# Patient Record
Sex: Male | Born: 1950 | Race: White | Hispanic: No | Marital: Married | Smoking: Former smoker
Health system: Southern US, Community
[De-identification: ages and names within clinical notes are randomized; demographics above are authoritative.]

## PROBLEM LIST (undated history)

## (undated) DIAGNOSIS — I10 Essential (primary) hypertension: Secondary | ICD-10-CM

## (undated) DIAGNOSIS — N3021 Other chronic cystitis with hematuria: Secondary | ICD-10-CM

## (undated) DIAGNOSIS — I639 Cerebral infarction, unspecified: Secondary | ICD-10-CM

## (undated) DIAGNOSIS — G454 Transient global amnesia: Secondary | ICD-10-CM

## (undated) DIAGNOSIS — E7800 Pure hypercholesterolemia, unspecified: Secondary | ICD-10-CM

## (undated) DIAGNOSIS — E790 Hyperuricemia without signs of inflammatory arthritis and tophaceous disease: Secondary | ICD-10-CM

## (undated) HISTORY — DX: Transient global amnesia: G45.4

## (undated) HISTORY — DX: Pure hypercholesterolemia, unspecified: E78.00

## (undated) HISTORY — DX: Other chronic cystitis with hematuria: N30.21

## (undated) HISTORY — DX: Hyperuricemia without signs of inflammatory arthritis and tophaceous disease: E79.0

## (undated) HISTORY — DX: Cerebral infarction, unspecified: I63.9

## (undated) HISTORY — DX: Essential (primary) hypertension: I10

---

## 2006-07-05 ENCOUNTER — Encounter: Admission: RE | Admit: 2006-07-05 | Discharge: 2006-07-05 | Payer: Self-pay | Admitting: Family Medicine

## 2008-02-01 IMAGING — CT CT ABDOMEN WO/W CM
2 of 7 series · 13 of 32 positions shown, 18 images · IV contrast (READICAT/WATER & [ID] OMNI 300)
Comparison: None.

CLINICAL DATA: Pelvic pain.  Question stones versus diverticulitis. 
 ABDOMEN CT WITHOUT AND WITH CONTRAST:
TECHNIQUE: Multidetector CT imaging of the abdomen was performed both before and during bolus administration of intravenous contrast.
 Contrast:  125 cc Omnipaque 300
TECHNIQUE: Multidetector CT imaging of the pelvis was performed following the standard protocol during bolus administration of intravenous contrast.

[Series 3: abd/pelvis w/ · axial · 0.81mm/px · z∈[-329,-9]mm · 5 of 96 slices shown, 10 images]
[im 16/96  soft-tissue]
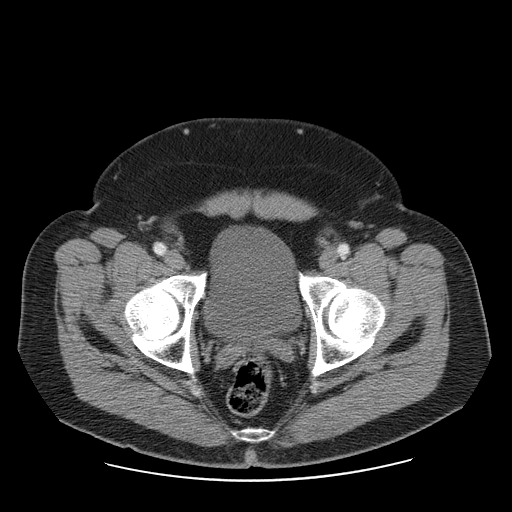
[im 16/96  bone]
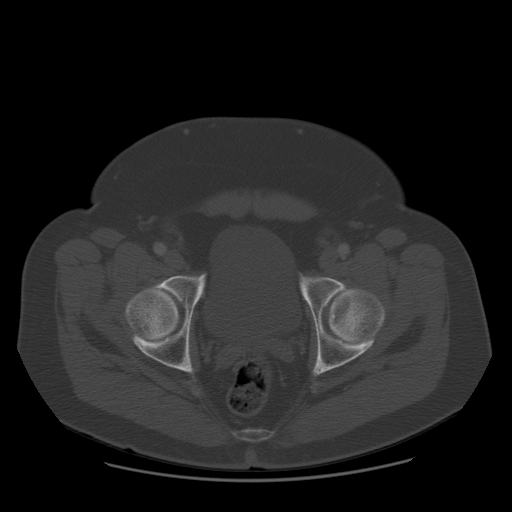
[im 32/96  soft-tissue]
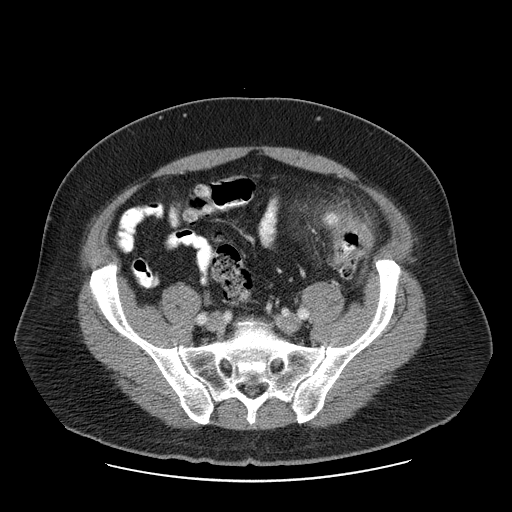
[im 32/96  lung]
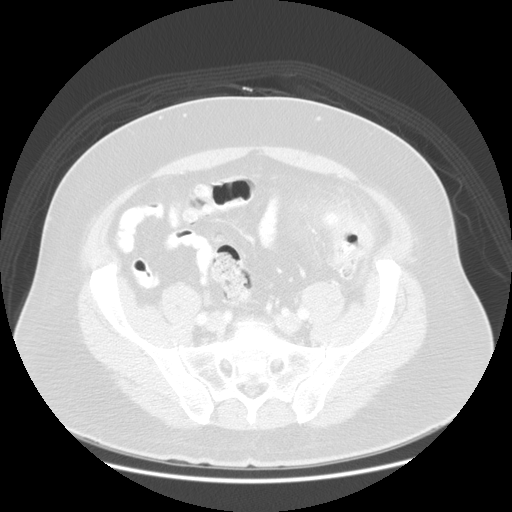
[im 48/96  soft-tissue]
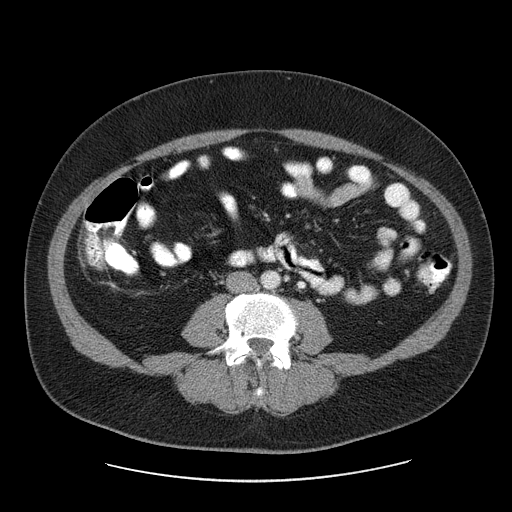
[im 48/96  lung]
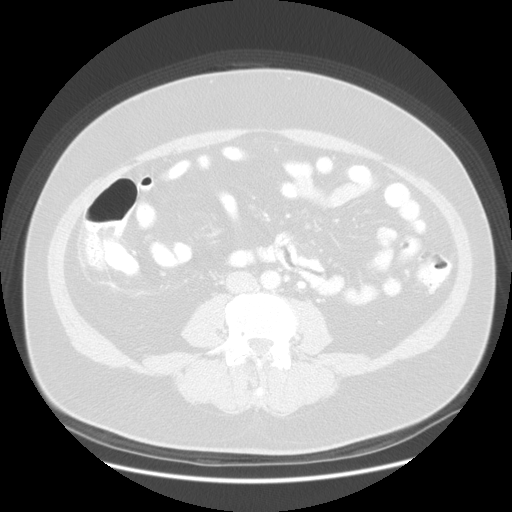
[im 64/96  soft-tissue]
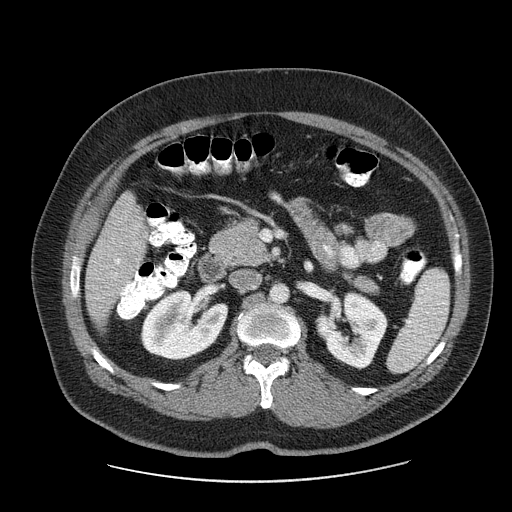
[im 64/96  lung]
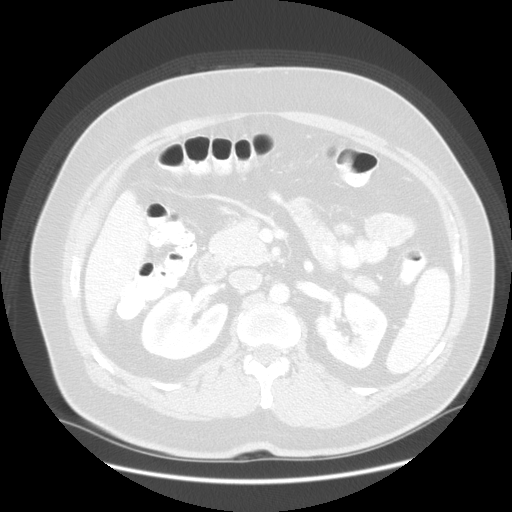
[im 80/96  soft-tissue]
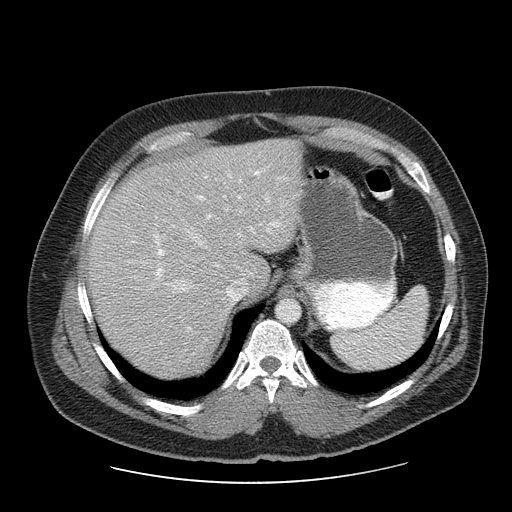
[im 80/96  lung]
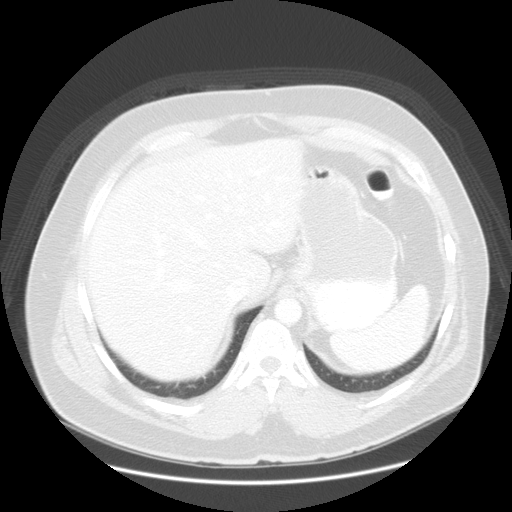

[Series 105: reformatted · sagittal · 0.95mm/px · 8 of 153 slices shown]
[im 17/153  soft-tissue]
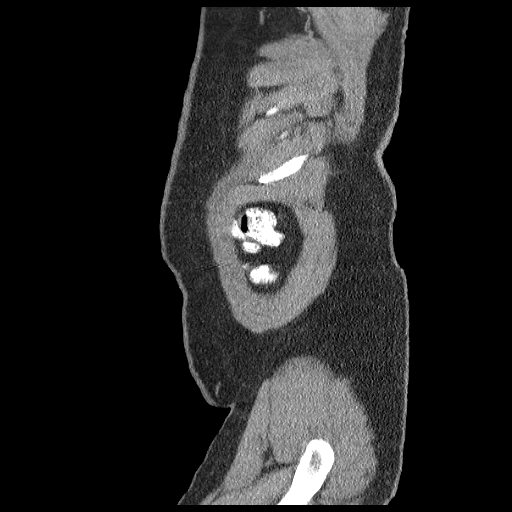
[im 34/153  soft-tissue]
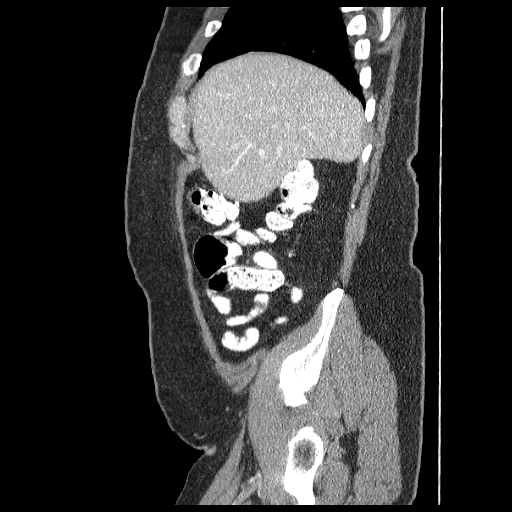
[im 51/153  soft-tissue]
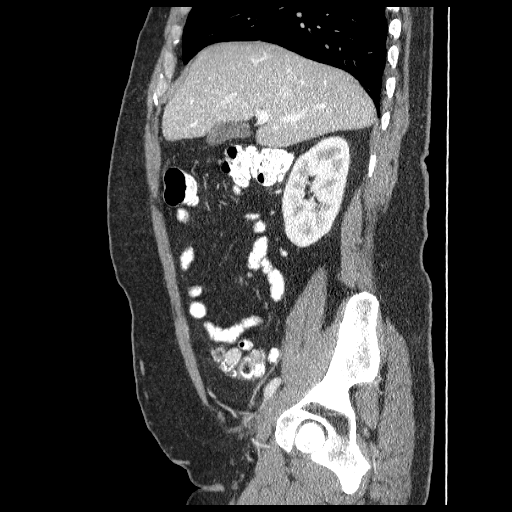
[im 68/153  soft-tissue]
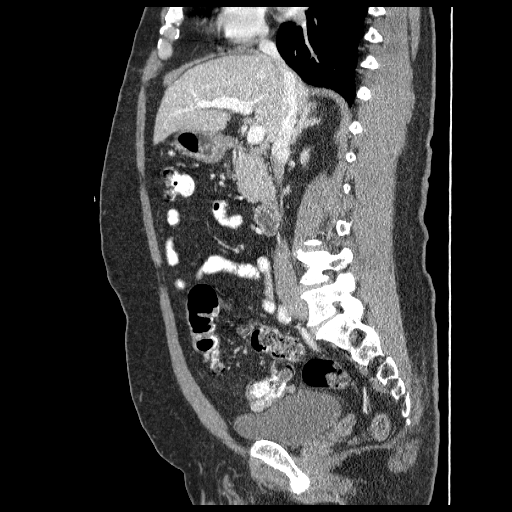
[im 85/153  soft-tissue]
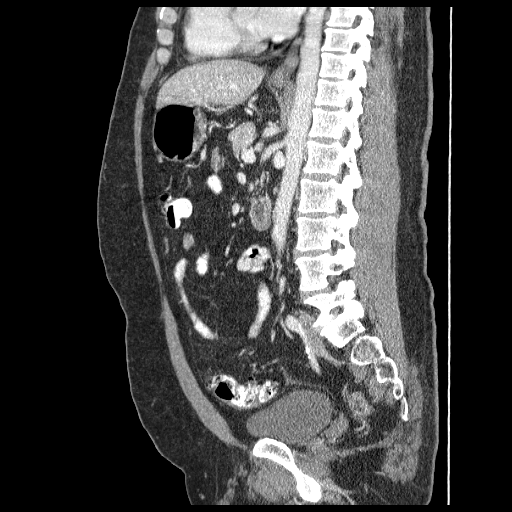
[im 102/153  soft-tissue]
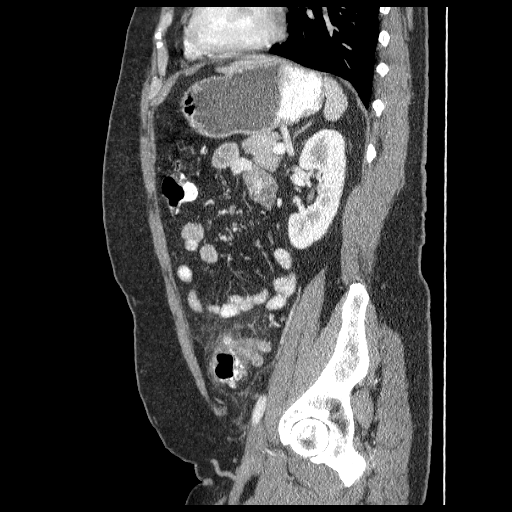
[im 119/153  soft-tissue]
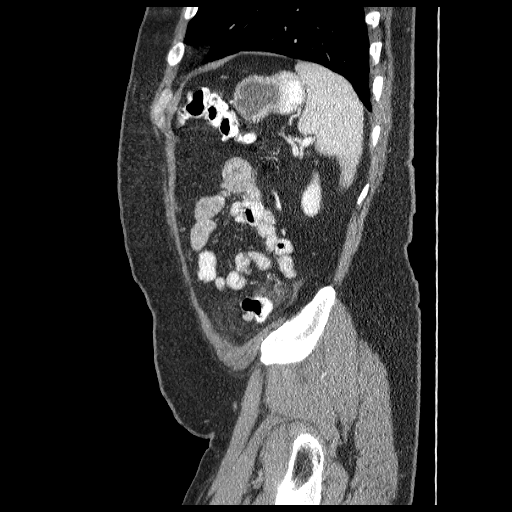
[im 136/153  soft-tissue]
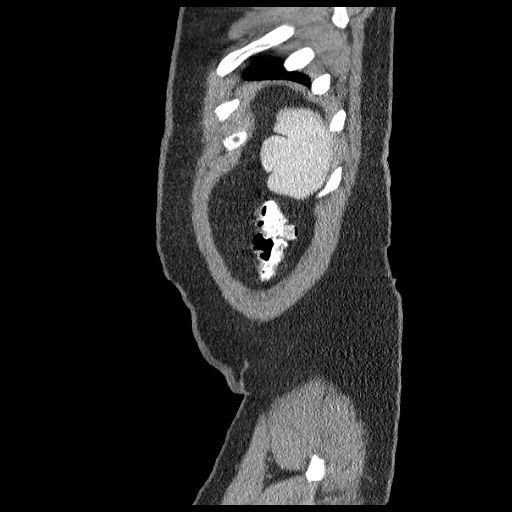

[13 of 32 positions shown; findings below may reference images not displayed]

FINDINGS: Unenhanced images demonstrate no renal calculi or hydronephrosis.  No proximal ureteric stones. 
 Postcontrast images demonstrate clear lung bases.  Normal heart size.  No pericardial or pleural effusion.  Decreased density of the liver consistent with mild steatosis.  No focal lesions in the liver, spleen, stomach, pancreas, gallbladder, adrenal glands, or kidneys.  
 Mildly prominent low retroperitoneal lymph nodes are not pathologic by size criteria and likely reactive.  
 There is no free air.  
 There is pericolonic edema centered at the descending/sigmoid junction most consistent with acute diverticulitis.  There is no drainable abscess or free perforation.  
 The remainder of the colon and appendix are normal.  Small bowel is normal and there is no significant ascites.
IMPRESSION: 1.  Findings consistent with descending/sigmoid colon diverticulitis, uncomplicated. 
 2.  No explanation for patient?s hematuria. 
 3.  Mild fatty liver. 
 PELVIS CT WITH CONTRAST:
FINDINGS: Pelvic large and small bowel loops are within normal limits.  No pelvic adenopathy or ascites.  The urinary bladder and prostate are normal.  
 Bone windows demonstrate no worrisome osseous lesion.
IMPRESSION: Uncomplicated diverticulitis, as above.

## 2008-11-06 ENCOUNTER — Encounter: Admission: RE | Admit: 2008-11-06 | Discharge: 2008-11-06 | Payer: Self-pay | Admitting: Family Medicine

## 2017-10-13 ENCOUNTER — Other Ambulatory Visit: Payer: Self-pay | Admitting: Family Medicine

## 2017-10-13 DIAGNOSIS — Z0000 Encounter for general adult medical examination without abnormal findings: Secondary | ICD-10-CM

## 2017-10-24 ENCOUNTER — Ambulatory Visit
Admission: RE | Admit: 2017-10-24 | Discharge: 2017-10-24 | Disposition: A | Discharge status: Home / Self Care | Payer: Medicare Other | Source: Ambulatory Visit | Attending: Family Medicine | Admitting: Family Medicine

## 2017-10-24 DIAGNOSIS — Z0000 Encounter for general adult medical examination without abnormal findings: Secondary | ICD-10-CM

## 2017-10-28 ENCOUNTER — Encounter: Payer: Self-pay | Admitting: *Deleted

## 2017-11-07 ENCOUNTER — Encounter: Payer: Self-pay | Admitting: Cardiology

## 2017-11-07 ENCOUNTER — Ambulatory Visit: Payer: Medicare Other | Admitting: Cardiology

## 2017-11-07 VITALS — BP 116/74 | HR 49 | Ht 68.5 in | Wt 194.2 lb

## 2017-11-07 DIAGNOSIS — E782 Mixed hyperlipidemia: Secondary | ICD-10-CM

## 2017-11-07 DIAGNOSIS — Z8249 Family history of ischemic heart disease and other diseases of the circulatory system: Secondary | ICD-10-CM | POA: Diagnosis not present

## 2017-11-07 DIAGNOSIS — Z8673 Personal history of transient ischemic attack (TIA), and cerebral infarction without residual deficits: Secondary | ICD-10-CM

## 2017-11-07 NOTE — Patient Instructions (Signed)
Medication Instructions:  Your physician recommends that you continue on your current medications as directed. Please refer to the Current Medication list given to you today.   Labwork: None ordered   Testing/Procedures: Your physician has requested that you have an exercise tolerance test. For further information please visit [email or web address] Please also follow instruction sheet, as given.    Follow-Up: Your physician wants you to follow-up in: 3 years with Dr. Anne FuSkains.  Please call our office to schedule the follow-up appointment.   Any Other Special Instructions Will Be Listed Below (If Applicable).     If you need a refill on your cardiac medications before your next appointment, please call your pharmacy.

## 2017-11-07 NOTE — Progress Notes (Signed)
Cardiology Office Note:    Date:  11/07/2017   ID:  Kevin Beltran, DOB 01/18/1951, MRN [id number]  PCP:  Lupita RaiderShaw, [name], MD  Cardiologist:  No primary care provider on file.   Referring MD: No ref. provider found     History of Present Illness:    Kevin RubensteinMark D Beltran is a 67 y.o. male with family history of ischemic heart disease here for evaluation for potential cardiac disease.  His father died at age 66 from MI.  Paternal grandfather died at age 361 from CAD.  Paternal uncle died at age 66 from CAD.  He used to smoke socially in his 4820s.  He is currently taking atorvastatin 20 mg once a day with fish oil supplementation as well.  No myalgias.  His last hemoglobin A1c was 5.7, sodium 138 potassium 4.9, ALT 31, LDL 68  EKG personally reviewed from 10/13/17 shows sinus rhythm/sinus bradycardia rate 49 with no other significant abnormalities.  Global amnesia 12 years ago. - few hours symptoms. Confused.  Was told that he had a mini stroke.  He was placed on Plavix monotherapy.    Overall, he enjoys hiking, spending time with his grandchildren.  [place] just [place] from [place] helping to take care of 1 of his grandkids that has RSV.  He has not noticed any significant anginal type symptoms.  He thinks that he may have been blessed with more of his mother's genetics than his father's.    Past Medical History:  Diagnosis Date  . Chronic cystitis with hematuria   . CVA (cerebral vascular accident) (HCC)   . Elevated uric acid in blood   . High cholesterol   . Transient global amnesia   . White coat syndrome with diagnosis of hypertension     History reviewed. No pertinent surgical history.  Current Medications: Current Meds  Medication Sig  . atorvastatin (LIPITOR) 20 MG tablet Take 20 mg by mouth daily.  . clopidogrel (PLAVIX) 75 MG tablet Take 75 mg by mouth daily.  . Multiple Vitamin (MULTIVITAMIN) tablet Take 1 tablet by mouth daily.  . Omega-3 Fatty Acids (FISH OIL) 1200 MG CAPS  Take 1 capsule by mouth 2 (two) times daily.     Allergies:   Oruvail [ketoprofen] and Monogen [alitraq]   Social History   Socioeconomic History  . Marital status: Married    Spouse name: [name]  . Number of children: 2  . Years of education: None  . Highest education level: None  Social Needs  . Financial resource strain: None  . Food insecurity - worry: None  . Food insecurity - inability: None  . Transportation needs - medical: None  . Transportation needs - non-medical: None  Occupational History  . Occupation: retired Retail buyernglish teacher  Tobacco Use  . Smoking status: Former Games developermoker  . Smokeless tobacco: Never Used  Substance and Sexual Activity  . Alcohol use: Yes    Comment: 12/week  . Drug use: No  . Sexual activity: None  Other Topics Concern  . None  Social History Narrative  . None     Family History: The patient's family history includes AAA (abdominal aortic aneurysm) in his mother; COPD in his mother; Cancer in his maternal grandfather; Coronary artery disease in his father, paternal grandfather, and paternal uncle; Diabetes in his sister; High Cholesterol in his sister; Hypertension in his sister; Kidney cancer in his mother.  ROS:   Please see the history of present illness.    No fevers  chills nausea vomiting syncope bleeding orthopnea PND chest pain.  All other systems reviewed and are negative.  EKGs/Labs/Other Studies Reviewed:    The following studies were reviewed today: Prior EKG, lab work, office notes reviewed  EKG:  As above  Recent Labs: No results found for requested labs within last 8760 hours.  Recent Lipid Panel No results found for: CHOL, TRIG, HDL, CHOLHDL, VLDL, LDLCALC, LDLDIRECT  Physical Exam:    VS:  BP 116/74   Pulse (!) 49   Ht 5' 8.5" (1.74 m)   Wt 194 lb 3.2 oz (88.1 kg)   SpO2 97%   BMI 29.10 kg/m     Wt Readings from Last 3 Encounters:  11/07/17 194 lb 3.2 oz (88.1 kg)     GEN:  Well nourished, well developed  in no acute distress HEENT: Normal NECK: No JVD; No carotid bruits LYMPHATICS: No lymphadenopathy CARDIAC: RRR, no murmurs, rubs, gallops RESPIRATORY:  Clear to auscultation without rales, wheezing or rhonchi  ABDOMEN: Soft, non-tender, non-distended MUSCULOSKELETAL:  No edema; No deformity  SKIN: Warm and dry NEUROLOGIC:  Alert and oriented x 3 PSYCHIATRIC:  Normal affect   ASSESSMENT:    1. Family history of premature CAD   2. History of TIA (transient ischemic attack)   3. Mixed hyperlipidemia    PLAN:    In order of problems listed above:  Strong early premature family history of CAD -His father had significant CAD in his mid 84s.  He is not having any specific current anginal-like symptoms and he is currently taking atorvastatin 20 mg once a day.  His LDL is well controlled.  He is concerned about the possibility of silent ischemia however and because of this, I will go ahead and set him up for a exercise treadmill test to make sure there are no high risk features.  He also has a prior history of TIA or a small stroke in the past.  Continue with Plavix as secondary prevention.  He is not on an aspirin.  Plavix monotherapy only.  Hyperlipidemia -Continue with atorvastatin 20 mg.  I am fine with him taking omega-3 supplement as well.  Lipids reviewed.  I think would be fine for him to touch base with me again in 3-5 years.  Of course, I will go ahead and give him the results of testing.   Medication Adjustments/Labs and Tests Ordered: Current medicines are reviewed at length with the patient today.  Concerns regarding medicines are outlined above.  Orders Placed This Encounter  Procedures  . EXERCISE TOLERANCE TEST (ETT)   No orders of the defined types were placed in this encounter.   Signed, Donato Schultz, MD  11/07/2017 9:42 AM    [facility]

## 2017-11-22 ENCOUNTER — Ambulatory Visit (INDEPENDENT_AMBULATORY_CARE_PROVIDER_SITE_OTHER): Payer: Medicare Other

## 2017-11-22 DIAGNOSIS — Z8673 Personal history of transient ischemic attack (TIA), and cerebral infarction without residual deficits: Secondary | ICD-10-CM

## 2017-11-22 DIAGNOSIS — Z8249 Family history of ischemic heart disease and other diseases of the circulatory system: Secondary | ICD-10-CM | POA: Diagnosis not present

## 2017-11-23 LAB — EXERCISE TOLERANCE TEST
CHL CUP RESTING HR STRESS: 61 {beats}/min
CSEPED: 8 min
CSEPEW: 10.1 METS
Exercise duration (sec): 36 s
MPHR: 155 {beats}/min
Peak HR: 150 {beats}/min
Percent HR: 96 %
RPE: 14

## 2017-11-25 ENCOUNTER — Encounter: Payer: Self-pay | Admitting: Cardiology

## 2017-11-25 ENCOUNTER — Telehealth: Payer: Self-pay | Admitting: Cardiology

## 2017-11-25 NOTE — Telephone Encounter (Signed)
Reviewed abnormal GXT results with patient.  Scheduled appt for Monday 3/11 with Dr Anne FuSkains to discuss cardiac cath.

## 2017-11-25 NOTE — Telephone Encounter (Signed)
Kevin Beltran is calling to get more details about his stress test was only told that it was abnormal . Would like more information . Please call

## 2017-11-28 ENCOUNTER — Ambulatory Visit: Payer: Medicare Other | Admitting: Cardiology

## 2017-11-28 ENCOUNTER — Encounter: Payer: Self-pay | Admitting: Cardiology

## 2017-11-28 VITALS — BP 144/80 | HR 65 | Ht 68.5 in | Wt 197.0 lb

## 2017-11-28 DIAGNOSIS — Z01818 Encounter for other preprocedural examination: Secondary | ICD-10-CM | POA: Diagnosis not present

## 2017-11-28 DIAGNOSIS — R9439 Abnormal result of other cardiovascular function study: Secondary | ICD-10-CM

## 2017-11-28 DIAGNOSIS — Z8249 Family history of ischemic heart disease and other diseases of the circulatory system: Secondary | ICD-10-CM

## 2017-11-28 DIAGNOSIS — E782 Mixed hyperlipidemia: Secondary | ICD-10-CM | POA: Diagnosis not present

## 2017-11-28 DIAGNOSIS — Z8673 Personal history of transient ischemic attack (TIA), and cerebral infarction without residual deficits: Secondary | ICD-10-CM

## 2017-11-28 NOTE — Patient Instructions (Addendum)
Medication Instructions:  The current medical regimen is effective;  continue present plan and medications.  Labwork: Please have blood work today. (CBC,BMP)  Testing/Procedures: Your physician has requested that you have a cardiac catheterization. Cardiac catheterization is used to diagnose and/or treat various heart conditions. Doctors may recommend this procedure for a number of different reasons. The most common reason is to evaluate chest pain. Chest pain can be a symptom of coronary artery disease (CAD), and cardiac catheterization can show whether plaque is narrowing or blocking your heart's arteries. This procedure is also used to evaluate the valves, as well as measure the blood flow and oxygen levels in different parts of your heart. For further information please visit [email or web address] Please follow instruction sheet, as given.  Follow-Up: Follow up after your cardiac cath.  If you need a refill on your cardiac medications before your next appointment, please call your pharmacy.  Thank you for choosing [facility]!!       [facility] [address] [facility] [address], [address] [place] [address] Dept: [phone] Loc: [phone]  Shelly RubensteinMark D Happe  11/28/2017  You are scheduled for a Cardiac Cath on Thursday December 01, 2017 with Dr. Cristal Deerhristopher End.  1. [facility]: [address] [place] [address] at 5:30 AM (two hours before your procedure to ensure your preparation). Free valet parking service is available.   Special note: Every effort is made to have your procedure done on time. Please understand that emergencies sometimes delay scheduled procedures.  2. Diet: Nothing to eat or drink after midnight.  3. Labs:  Please have blood work today (CBC,BMP)  4. Medication instructions in preparation for your  procedure:  On the morning of your procedure, take your ASA and any morning medicines.  You may use sips of water.  5. Plan for one night stay--bring personal belongings. 6. Bring a current list of your medications and current insurance cards. 7. You MUST have a responsible person to drive you home. 8. Someone MUST be with you the first 24 hours after you arrive home or your discharge will be delayed. 9. Please wear clothes that are easy to get on and off and wear slip-on shoes.  Thank you for allowing us to care for you!   -- [facility]

## 2017-11-28 NOTE — Progress Notes (Signed)
Cardiology Office Note:    Date:  11/28/2017   ID:  Kevin Beltran, DOB 01/21/1951, MRN [id number]  PCP:  Lupita RaiderShaw, [name], MD  Cardiologist:  No primary care provider on file.   Referring MD: Lupita RaiderShaw, [name], MD     History of Present Illness:    Kevin Beltran is a 67 y.o. male with family history of ischemic heart disease here for follow up of potential cardiac disease, with abnormal ETT.  His father died at age 66 from MI.  Paternal grandfather died at age 66 from CAD.  Paternal uncle died at age 66 from CAD.  He used to smoke socially in his 3420s.  He is currently taking atorvastatin 20 mg once a day with fish oil supplementation as well.  No myalgias.  His last hemoglobin A1c was 5.7, sodium 138 potassium 4.9, ALT 31, LDL 68  EKG personally reviewed from 10/13/17 shows sinus rhythm/sinus bradycardia rate 49 with no other significant abnormalities.  Global amnesia 12 years ago. - few hours symptoms. Confused.  Was told that he had a mini stroke.  He was placed on Plavix monotherapy.    Overall, he enjoys hiking, spending time with his grandchildren.  He just came [place] from the mountains helping to take care of 1 of his grandkids that has RSV.  He has not noticed any significant anginal type symptoms.  He thinks that he may have been blessed with more of his mother's genetics than his father's.  11/28/17 - had ETT which demonstrated markedly reduced ST segment depression 2 mm.  Based upon this, we are here to discuss cardiac catheterization. Now with no SOB, no bleeding, no orthopnea.     Past Medical History:  Diagnosis Date  . Chronic cystitis with hematuria   . CVA (cerebral vascular accident) (HCC)   . Elevated uric acid in blood   . High cholesterol   . Transient global amnesia   . White coat syndrome with diagnosis of hypertension     History reviewed. No pertinent surgical history.  Current Medications: Current Meds  Medication Sig  . atorvastatin (LIPITOR) 20 MG tablet  Take 20 mg by mouth daily.  . clopidogrel (PLAVIX) 75 MG tablet Take 75 mg by mouth daily.  . Multiple Vitamin (MULTIVITAMIN) tablet Take 1 tablet by mouth daily.  . Omega-3 Fatty Acids (FISH OIL) 1200 MG CAPS Take 1 capsule by mouth 2 (two) times daily.     Allergies:   Oruvail [ketoprofen] and Monogen [alitraq]   Social History   Socioeconomic History  . Marital status: Married    Spouse name: Kevin Beltran  . Number of children: 2  . Years of education: None  . Highest education level: None  Social Needs  . Financial resource strain: None  . Food insecurity - worry: None  . Food insecurity - inability: None  . Transportation needs - medical: None  . Transportation needs - non-medical: None  Occupational History  . Occupation: retired Retail buyernglish teacher  Tobacco Use  . Smoking status: Former Games developermoker  . Smokeless tobacco: Never Used  Substance and Sexual Activity  . Alcohol use: Yes    Comment: 12/week  . Drug use: No  . Sexual activity: None  Other Topics Concern  . None  Social History Narrative  . None     Family History: The patient's family history includes AAA (abdominal aortic aneurysm) in his mother; COPD in his mother; Cancer in his maternal grandfather; Coronary artery disease in his father, paternal grandfather,  and paternal uncle; Diabetes in his sister; High Cholesterol in his sister; Hypertension in his sister; Kidney cancer in his mother.  ROS:   Please see the history of present illness.    All other ROS neg  EKGs/Labs/Other Studies Reviewed:    The following studies were reviewed today: Prior EKG, lab work, office notes reviewed  EKG:  As above  Recent Labs: No results found for requested labs within last 8760 hours.  Recent Lipid Panel No results found for: CHOL, TRIG, HDL, CHOLHDL, VLDL, LDLCALC, LDLDIRECT  Physical Exam:    VS:  BP (!) 144/80   Pulse 65   Ht 5' 8.5" (1.74 m)   Wt 197 lb (89.4 kg)   BMI 29.52 kg/m     Wt Readings from Last 3  Encounters:  11/28/17 197 lb (89.4 kg)  11/07/17 194 lb 3.2 oz (88.1 kg)     GEN: Well nourished, well developed, in no acute distress  HEENT: normal  Neck: no JVD, carotid bruits, or masses Cardiac: RRR; no murmurs, rubs, or gallops,no edema  Respiratory:  clear to auscultation bilaterally, normal work of breathing GI: soft, nontender, nondistended, + BS MS: no deformity or atrophy  Skin: warm and dry, no rash Neuro:  Alert and Oriented x 3, Strength and sensation are intact Psych: euthymic mood, full affect   ASSESSMENT:    1. Abnormal stress test   2. Pre-operative examination   3. Family history of premature CAD   4. Mixed hyperlipidemia   5. History of TIA (transient ischemic attack)    PLAN:    In order of problems listed above:  Abnormal stress test/strong early premature family history of CAD --Exercise treadmill test abnormal with 2 mm ST segment depression.  With father having significant CAD in his mid 78s and his son already having coronary artery disease as well as his prior history of TIA, we will go ahead and proceed with cardiac catheterization.  Risks and benefits discussed including stroke heart attack death renal impairment bleeding.  He is willing to proceed.  He is currently on Plavix monotherapy after his prior TIA.  We will continue with this.  Hyperlipidemia -Continue with atorvastatin 20 mg.  I am fine with him taking omega-3 supplement as well.  Lipids reviewed.    Medication Adjustments/Labs and Tests Ordered: Current medicines are reviewed at length with the patient today.  Concerns regarding medicines are outlined above.  Orders Placed This Encounter  Procedures  . CBC  . Basic metabolic panel   No orders of the defined types were placed in this encounter.   Signed, Donato Schultz, MD  11/28/2017 2:29 PM    [facility]

## 2017-11-28 NOTE — H&P (View-Only) (Signed)
Cardiology Office Note:    Date:  11/28/2017   ID:  Kevin Beltran Kiser, DOB 01/21/1951, MRN [id number]  PCP:  Lupita RaiderShaw, [name], MD  Cardiologist:  No primary care provider on file.   Referring MD: Lupita RaiderShaw, [name], MD     History of Present Illness:    Kevin Beltran [place] is a 67 y.o. male with family history of ischemic heart disease here for follow up of potential cardiac disease, with abnormal ETT.  His father died at age 66 from MI.  Paternal grandfather died at age 66 from CAD.  Paternal uncle died at age 66 from CAD.  He used to smoke socially in his 3420s.  He is currently taking atorvastatin 20 mg once a day with fish oil supplementation as well.  No myalgias.  His last hemoglobin A1c was 5.7, sodium 138 potassium 4.9, ALT 31, LDL 68  EKG personally reviewed from 10/13/17 shows sinus rhythm/sinus bradycardia rate 49 with no other significant abnormalities.  Global amnesia 12 years ago. - few hours symptoms. Confused.  Was told that he had a mini stroke.  He was placed on Plavix monotherapy.    Overall, he enjoys hiking, spending time with his grandchildren.  He just [place] from [place] helping to take care of 1 of his grandkids that has RSV.  He has not noticed any significant anginal type symptoms.  He thinks that he may have been blessed with more of his mother's genetics than his father's.  11/28/17 - had ETT which demonstrated markedly reduced ST segment depression 2 mm.  Based upon this, we are here to discuss cardiac catheterization. Now with no SOB, no bleeding, no orthopnea.     Past Medical History:  Diagnosis Date  . Chronic cystitis with hematuria   . CVA (cerebral vascular accident) (HCC)   . Elevated uric acid in blood   . High cholesterol   . Transient global amnesia   . White coat syndrome with diagnosis of hypertension     History reviewed. No pertinent surgical history.  Current Medications: Current Meds  Medication Sig  . atorvastatin (LIPITOR) 20 MG tablet  Take 20 mg by mouth daily.  . clopidogrel (PLAVIX) 75 MG tablet Take 75 mg by mouth daily.  . Multiple Vitamin (MULTIVITAMIN) tablet Take 1 tablet by mouth daily.  . Omega-3 Fatty Acids (FISH OIL) 1200 MG CAPS Take 1 capsule by mouth 2 (two) times daily.     Allergies:   Oruvail [ketoprofen] and Monogen [alitraq]   Social History   Socioeconomic History  . Marital status: Married    Spouse name: [name]  . Number of children: 2  . Years of education: None  . Highest education level: None  Social Needs  . Financial resource strain: None  . Food insecurity - worry: None  . Food insecurity - inability: None  . Transportation needs - medical: None  . Transportation needs - non-medical: None  Occupational History  . Occupation: retired Retail buyernglish teacher  Tobacco Use  . Smoking status: Former Games developermoker  . Smokeless tobacco: Never Used  Substance and Sexual Activity  . Alcohol use: Yes    Comment: 12/week  . Drug use: No  . Sexual activity: None  Other Topics Concern  . None  Social History Narrative  . None     Family History: The patient's family history includes AAA (abdominal aortic aneurysm) in his mother; COPD in his mother; Cancer in his maternal grandfather; Coronary artery disease in his father, paternal grandfather,  and paternal uncle; Diabetes in his sister; High Cholesterol in his sister; Hypertension in his sister; Kidney cancer in his mother.  ROS:   Please see the history of present illness.    All other ROS neg  EKGs/Labs/Other Studies Reviewed:    The following studies were reviewed today: Prior EKG, lab work, office notes reviewed  EKG:  As above  Recent Labs: No results found for requested labs within last 8760 hours.  Recent Lipid Panel No results found for: CHOL, TRIG, HDL, CHOLHDL, VLDL, LDLCALC, LDLDIRECT  Physical Exam:    VS:  BP (!) 144/80   Pulse 65   Ht 5' 8.5" (1.74 m)   Wt 197 lb (89.4 kg)   BMI 29.52 kg/m     Wt Readings from Last 3  Encounters:  11/28/17 197 lb (89.4 kg)  11/07/17 194 lb 3.2 oz (88.1 kg)     GEN: Well nourished, well developed, in no acute distress  HEENT: normal  Neck: no JVD, carotid bruits, or masses Cardiac: RRR; no murmurs, rubs, or gallops,no edema  Respiratory:  clear to auscultation bilaterally, normal work of breathing GI: soft, nontender, nondistended, + BS MS: no deformity or atrophy  Skin: warm and dry, no rash Neuro:  Alert and Oriented x 3, Strength and sensation are intact Psych: euthymic mood, full affect   ASSESSMENT:    1. Abnormal stress test   2. Pre-operative examination   3. Family history of premature CAD   4. Mixed hyperlipidemia   5. History of TIA (transient ischemic attack)    PLAN:    In order of problems listed above:  Abnormal stress test/strong early premature family history of CAD --Exercise treadmill test abnormal with 2 mm ST segment depression.  With father having significant CAD in his mid 78s and his son already having coronary artery disease as well as his prior history of TIA, we will go ahead and proceed with cardiac catheterization.  Risks and benefits discussed including stroke heart attack death renal impairment bleeding.  He is willing to proceed.  He is currently on Plavix monotherapy after his prior TIA.  We will continue with this.  Hyperlipidemia -Continue with atorvastatin 20 mg.  I am fine with him taking omega-3 supplement as well.  Lipids reviewed.    Medication Adjustments/Labs and Tests Ordered: Current medicines are reviewed at length with the patient today.  Concerns regarding medicines are outlined above.  Orders Placed This Encounter  Procedures  . CBC  . Basic metabolic panel   No orders of the defined types were placed in this encounter.   Signed, Donato Schultz, MD  11/28/2017 2:29 PM    [facility]

## 2017-11-29 ENCOUNTER — Telehealth: Payer: Self-pay | Admitting: *Deleted

## 2017-11-29 LAB — BASIC METABOLIC PANEL
BUN / CREAT RATIO: 21 (ref 10–24)
BUN: 21 mg/dL (ref 8–27)
CO2: 22 mmol/L (ref 20–29)
CREATININE: 1.02 mg/dL (ref 0.76–1.27)
Calcium: 9.5 mg/dL (ref 8.6–10.2)
Chloride: 106 mmol/L (ref 96–106)
GFR, EST AFRICAN AMERICAN: 89 mL/min/{1.73_m2} (ref >59)
GFR, EST NON AFRICAN AMERICAN: 77 mL/min/{1.73_m2} (ref >59)
GLUCOSE: 84 mg/dL (ref 65–99)
Potassium: 4.4 mmol/L (ref 3.5–5.2)
SODIUM: 145 mmol/L — AB (ref 134–144)

## 2017-11-29 LAB — CBC
HEMATOCRIT: 46.6 % (ref 37.5–51.0)
HEMOGLOBIN: 15.9 g/dL (ref 13.0–17.7)
MCH: 30.3 pg (ref 26.6–33.0)
MCHC: 34.1 g/dL (ref 31.5–35.7)
MCV: 89 fL (ref 79–97)
Platelets: 203 10*3/uL (ref 150–379)
RBC: 5.25 x10E6/uL (ref 4.14–5.80)
RDW: 12.9 % (ref 12.3–15.4)
WBC: 9.8 10*3/uL (ref 3.4–10.8)

## 2017-11-29 NOTE — Telephone Encounter (Addendum)
Pt contacted pre-catheterization scheduled at [facility] for: Wednesday December 01, 2017 7:30 AM Verified [facility] and [facility] at: 5:30 AM  Nothing to eat or drink after midnight prior to cath Verified no diabetes medications Verified allergies in Epic.   AM meds can be  taken pre-cath with sip of water including: ASA 81 mg  Plavix 75 mg -Pt is on this for a history of TIA-Pt states he takes in the evening, will take evening prior to cath.   Confirmed patient has responsible person to drive home post procedure and observe patient for 24 hours: yes

## 2017-12-01 ENCOUNTER — Other Ambulatory Visit: Payer: Self-pay

## 2017-12-01 ENCOUNTER — Ambulatory Visit (HOSPITAL_COMMUNITY)
Admission: RE | Disposition: A | Discharge status: Home / Self Care | Payer: Self-pay | Source: Ambulatory Visit | Attending: Internal Medicine

## 2017-12-01 ENCOUNTER — Ambulatory Visit (HOSPITAL_COMMUNITY)
Admission: RE | Admit: 2017-12-01 | Discharge: 2017-12-01 | Disposition: A | Discharge status: Home / Self Care | Payer: Medicare Other | Source: Ambulatory Visit | Attending: Internal Medicine | Admitting: Internal Medicine

## 2017-12-01 ENCOUNTER — Encounter (HOSPITAL_COMMUNITY): Payer: Self-pay | Admitting: Internal Medicine

## 2017-12-01 DIAGNOSIS — I2510 Atherosclerotic heart disease of native coronary artery without angina pectoris: Secondary | ICD-10-CM | POA: Insufficient documentation

## 2017-12-01 DIAGNOSIS — Z8673 Personal history of transient ischemic attack (TIA), and cerebral infarction without residual deficits: Secondary | ICD-10-CM | POA: Diagnosis not present

## 2017-12-01 DIAGNOSIS — Z7902 Long term (current) use of antithrombotics/antiplatelets: Secondary | ICD-10-CM | POA: Insufficient documentation

## 2017-12-01 DIAGNOSIS — I10 Essential (primary) hypertension: Secondary | ICD-10-CM | POA: Insufficient documentation

## 2017-12-01 DIAGNOSIS — Z8249 Family history of ischemic heart disease and other diseases of the circulatory system: Secondary | ICD-10-CM | POA: Diagnosis not present

## 2017-12-01 DIAGNOSIS — Z87891 Personal history of nicotine dependence: Secondary | ICD-10-CM | POA: Insufficient documentation

## 2017-12-01 DIAGNOSIS — R9439 Abnormal result of other cardiovascular function study: Secondary | ICD-10-CM | POA: Diagnosis present

## 2017-12-01 DIAGNOSIS — N3021 Other chronic cystitis with hematuria: Secondary | ICD-10-CM | POA: Insufficient documentation

## 2017-12-01 DIAGNOSIS — E782 Mixed hyperlipidemia: Secondary | ICD-10-CM | POA: Diagnosis not present

## 2017-12-01 HISTORY — PX: LEFT HEART CATH AND CORONARY ANGIOGRAPHY: CATH118249

## 2017-12-01 LAB — PROTIME-INR
INR: 1.03
PROTHROMBIN TIME: 13.4 s (ref 11.4–15.2)

## 2017-12-01 SURGERY — LEFT HEART CATH AND CORONARY ANGIOGRAPHY
Anesthesia: LOCAL

## 2017-12-01 MED ORDER — MIDAZOLAM HCL 2 MG/2ML IJ SOLN
INTRAMUSCULAR | Status: AC
  Filled 2017-12-01: qty 2

## 2017-12-01 MED ORDER — FENTANYL CITRATE (PF) 100 MCG/2ML IJ SOLN
INTRAMUSCULAR | Status: AC
  Filled 2017-12-01: qty 2

## 2017-12-01 MED ORDER — VERAPAMIL HCL 2.5 MG/ML IV SOLN
INTRAVENOUS | Status: AC
Start: 2017-12-01 — End: ?
  Filled 2017-12-01: qty 2

## 2017-12-01 MED ORDER — HEPARIN SODIUM (PORCINE) 1000 UNIT/ML IJ SOLN
INTRAMUSCULAR | Status: DC | PRN
  Administered 2017-12-01: 4500 [IU] via INTRAVENOUS

## 2017-12-01 MED ORDER — SODIUM CHLORIDE 0.9 % WEIGHT BASED INFUSION: 1.0000 mL/kg/h | INTRAVENOUS | Status: DC

## 2017-12-01 MED ORDER — CLOPIDOGREL BISULFATE 75 MG PO TABS
ORAL_TABLET | ORAL | Status: AC
  Administered 2017-12-01: 75 mg via ORAL
  Filled 2017-12-01: qty 1

## 2017-12-01 MED ORDER — MIDAZOLAM HCL 2 MG/2ML IJ SOLN
INTRAMUSCULAR | Status: DC | PRN
  Administered 2017-12-01: 1 mg via INTRAVENOUS

## 2017-12-01 MED ORDER — SODIUM CHLORIDE 0.9 % IV SOLN: INTRAVENOUS | Status: DC

## 2017-12-01 MED ORDER — HEPARIN (PORCINE) IN NACL 2-0.9 UNIT/ML-% IJ SOLN
INTRAMUSCULAR | Status: AC
  Filled 2017-12-01: qty 1000

## 2017-12-01 MED ORDER — IOPAMIDOL (ISOVUE-370) INJECTION 76%
INTRAVENOUS | Status: AC
Start: 2017-12-01 — End: ?
  Filled 2017-12-01: qty 100

## 2017-12-01 MED ORDER — HEPARIN SODIUM (PORCINE) 1000 UNIT/ML IJ SOLN
INTRAMUSCULAR | Status: AC
  Filled 2017-12-01: qty 1

## 2017-12-01 MED ORDER — ASPIRIN 81 MG PO CHEW: 81.0000 mg | CHEWABLE_TABLET | ORAL | Status: DC

## 2017-12-01 MED ORDER — IOPAMIDOL (ISOVUE-370) INJECTION 76%
INTRAVENOUS | Status: DC | PRN
  Administered 2017-12-01: 50 mL via INTRA_ARTERIAL

## 2017-12-01 MED ORDER — SODIUM CHLORIDE 0.9% FLUSH: 3.0000 mL | Freq: Two times a day (BID) | INTRAVENOUS | Status: DC

## 2017-12-01 MED ORDER — HEPARIN (PORCINE) IN NACL 2-0.9 UNIT/ML-% IJ SOLN
INTRAMUSCULAR | Status: DC | PRN
  Administered 2017-12-01: 10 mL via INTRA_ARTERIAL

## 2017-12-01 MED ORDER — LIDOCAINE HCL (PF) 1 % IJ SOLN
INTRAMUSCULAR | Status: DC | PRN
  Administered 2017-12-01: 2 mL via SUBCUTANEOUS

## 2017-12-01 MED ORDER — SODIUM CHLORIDE 0.9% FLUSH: 3.0000 mL | INTRAVENOUS | Status: DC | PRN

## 2017-12-01 MED ORDER — SODIUM CHLORIDE 0.9 % IV SOLN: 250.0000 mL | INTRAVENOUS | Status: DC | PRN

## 2017-12-01 MED ORDER — LIDOCAINE HCL (PF) 1 % IJ SOLN
INTRAMUSCULAR | Status: AC
  Filled 2017-12-01: qty 30

## 2017-12-01 MED ORDER — HEPARIN (PORCINE) IN NACL 2-0.9 UNIT/ML-% IJ SOLN
INTRAMUSCULAR | Status: AC | PRN
  Administered 2017-12-01 (×2): 500 mL

## 2017-12-01 MED ORDER — FENTANYL CITRATE (PF) 100 MCG/2ML IJ SOLN
INTRAMUSCULAR | Status: DC | PRN
  Administered 2017-12-01: 50 ug via INTRAVENOUS

## 2017-12-01 MED ORDER — SODIUM CHLORIDE 0.9 % WEIGHT BASED INFUSION
3.0000 mL/kg/h | INTRAVENOUS | Status: AC
  Administered 2017-12-01: 3 mL/kg/h via INTRAVENOUS

## 2017-12-01 MED ORDER — CLOPIDOGREL BISULFATE 75 MG PO TABS
75.0000 mg | ORAL_TABLET | Freq: Once | ORAL | Status: AC
  Administered 2017-12-01: 75 mg via ORAL

## 2017-12-01 SURGICAL SUPPLY — 13 items
BAND CMPR LRG ZPHR (HEMOSTASIS) ×1
BAND ZEPHYR COMPRESS 30 LONG (HEMOSTASIS) ×1 IMPLANT
CATH INFINITI 5 FR JL3.5 (CATHETERS) ×1 IMPLANT
CATH INFINITI JR4 5F (CATHETERS) ×1 IMPLANT
GUIDEWIRE INQWIRE 1.5J.035X260 (WIRE) IMPLANT
INQWIRE 1.5J .035X260CM (WIRE) ×2
KIT HEART LEFT (KITS) ×2 IMPLANT
NDL PERC 21GX4CM (NEEDLE) IMPLANT
NEEDLE PERC 21GX4CM (NEEDLE) ×2 IMPLANT
PACK CARDIAC CATHETERIZATION (CUSTOM PROCEDURE TRAY) ×2 IMPLANT
SHEATH RAIN RADIAL 21G 6FR (SHEATH) ×1 IMPLANT
TRANSDUCER W/STOPCOCK (MISCELLANEOUS) ×2 IMPLANT
TUBING CIL FLEX 10 FLL-RA (TUBING) ×2 IMPLANT

## 2017-12-01 NOTE — Discharge Instructions (Signed)

## 2017-12-01 NOTE — Interval H&P Note (Signed)
History and Physical Interval Note:  12/01/2017 7:11 AM  Kevin Beltran  has presented today for cardiac catheterization, with the diagnosis of abnormal stress test. The various methods of treatment have been discussed with the patient and family. After consideration of risks, benefits and other options for treatment, the patient has consented to  Procedure(s): LEFT HEART CATH AND CORONARY ANGIOGRAPHY (N/A) as a surgical intervention .  The patient's history has been reviewed, patient examined, no change in status, stable for surgery.  I have reviewed the patient's chart and labs.  Questions were answered to the patient's satisfaction.    Cath Lab Visit (complete for each Cath Lab visit)  Clinical Evaluation Leading to the Procedure:   ACS: No.  Non-ACS:    Anginal Classification: CCS I  Anti-ischemic medical therapy: No Therapy  Non-Invasive Test Results: Intermediate-risk stress test findings: cardiac mortality 1-3%/year  Prior CABG: No previous CABG  Kevin Beltran

## 2017-12-01 NOTE — Research (Signed)
CADFEM Informed Consent   Subject Name: Kevin Beltran  Subject met inclusion and exclusion criteria.  The informed consent form, study requirements and expectations were reviewed with the subject and questions and concerns were addressed prior to the signing of the consent form.  The subject verbalized understanding of the trail requirements.  The subject agreed to participate in the CADFEM trial and signed the informed consent.  The informed consent was obtained prior to performance of any protocol-specific procedures for the subject.  A copy of the signed informed consent was given to the subject and a copy was placed in the subject's medical record.  Christena Flake 12/01/2017, 06:50 AM

## 2017-12-02 MED FILL — Heparin Sodium (Porcine) 2 Unit/ML in Sodium Chloride 0.9%: INTRAMUSCULAR | Qty: 1000 | Status: AC

## 2017-12-08 ENCOUNTER — Telehealth: Payer: Self-pay | Admitting: *Deleted

## 2017-12-08 NOTE — Telephone Encounter (Signed)
-----   Message from Jake BatheMark C Skains, MD sent at 12/07/2017 11:33 AM EDT ----- Regarding: RE: Cath 12/01/17 No need for followup.  Donato SchultzMark Skains, MD  ----- Message ----- From: Jacqlyn KraussLankford, Jacquel Redditt M, RN Sent: 12/06/2017   3:44 PM To: Jake BatheMark C Skains, MD, Jacqlyn KraussAnne M Minnette Merida, RN, # Subject: Cath 12/01/17                                   Hi Dr Mason Dibiasio FuSkains,  He does not have any post cath follow-up scheduled --how soon should I schedule follow-up for him?  Thanks, Thurston HoleAnne

## 2017-12-22 ENCOUNTER — Ambulatory Visit: Payer: Medicare Other | Admitting: Cardiology

## 2019-05-23 IMAGING — US US ABDOMINAL AORTA SCREENING AAA
1 series · 14 of 18 positions shown · non-contrast
Comparison: None.

CLINICAL DATA: 66-year-old male with a history of smoking

EXAM:
ULTRASOUND OF ABDOMINAL AORTA
TECHNIQUE: Ultrasound examination of the abdominal aorta was performed to
evaluate for abdominal aortic aneurysm.

[Series 1: us abdominal aorta screening aaa · 0.30mm/px · 14 of 18 slices shown]
[im 1/18]
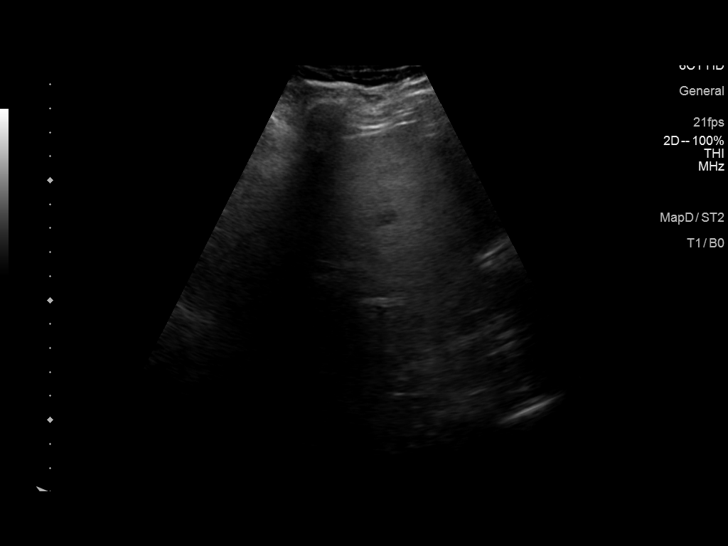
[im 2/18]
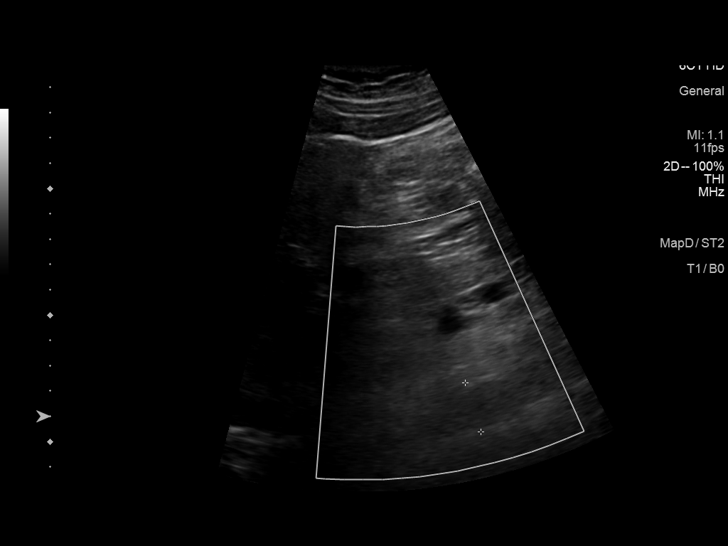
[im 4/18]
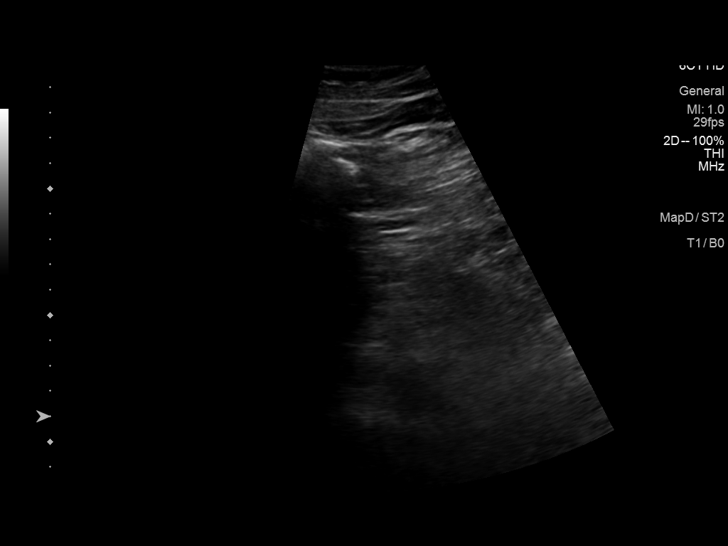
[im 5/18]
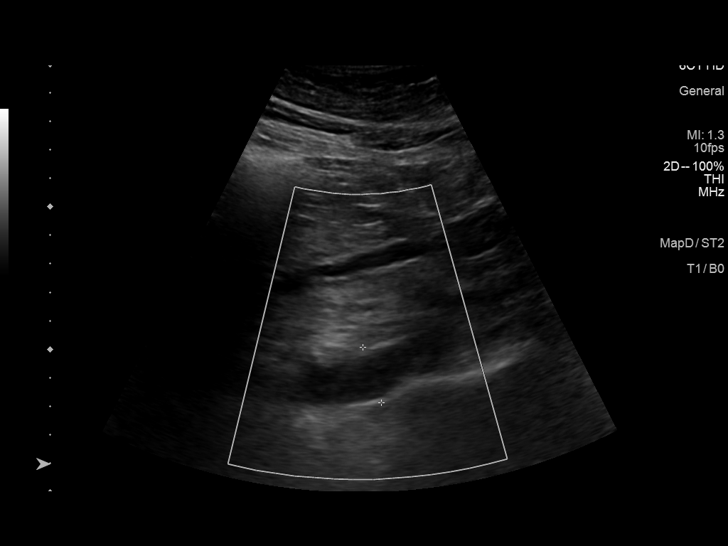
[im 6/18]
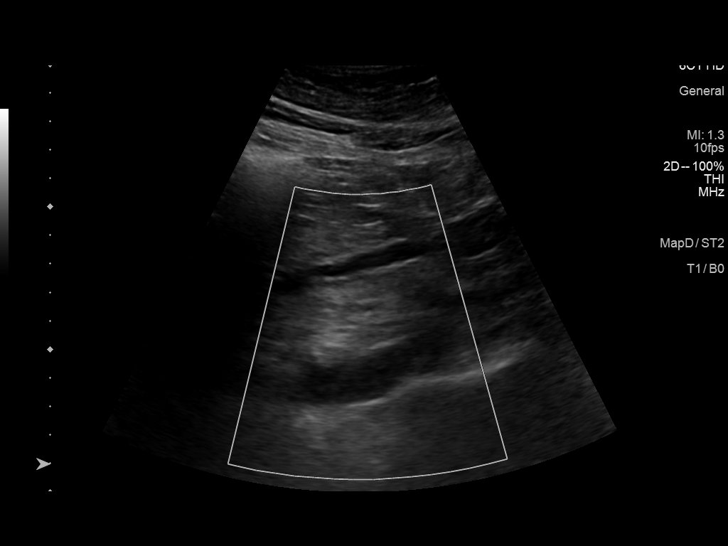
[im 8/18]
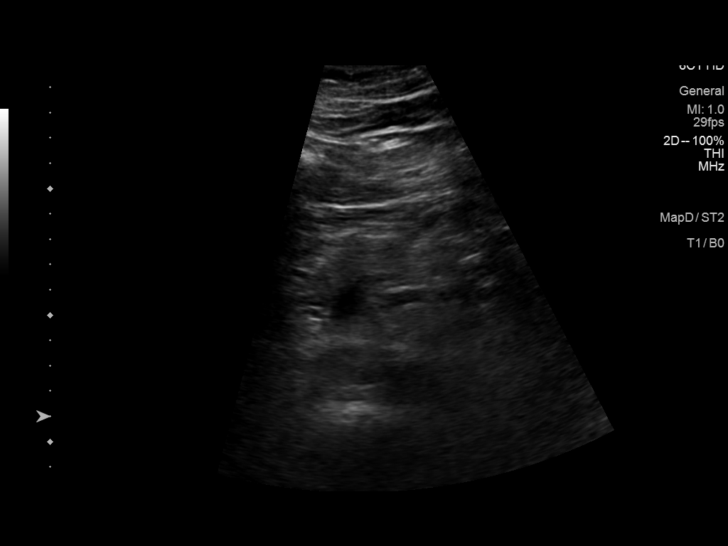
[im 9/18]
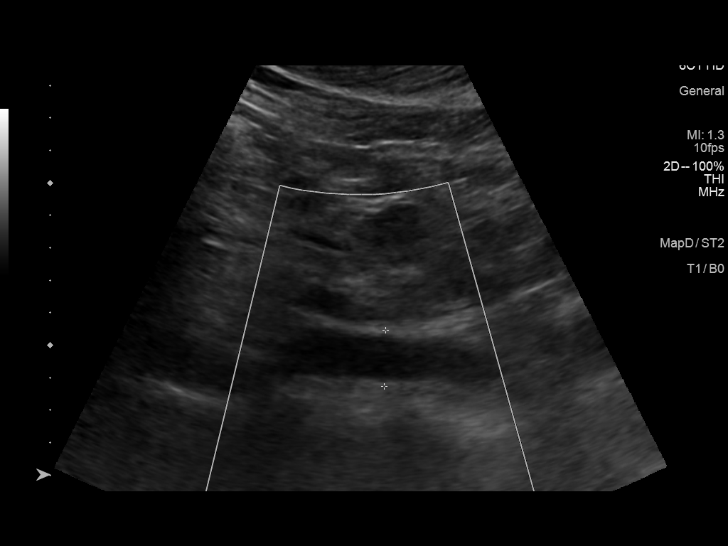
[im 10/18]
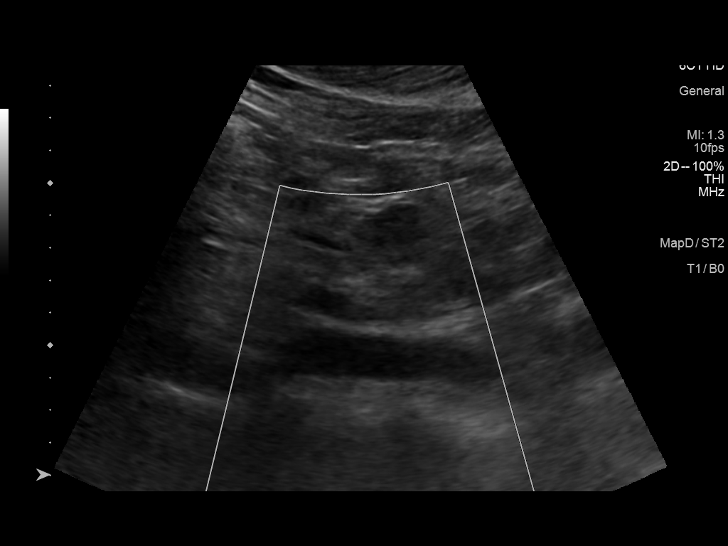
[im 11/18]
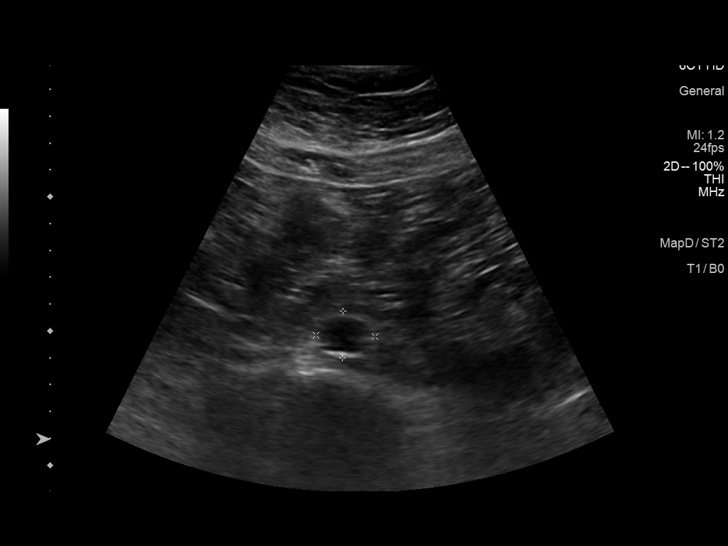
[im 13/18]
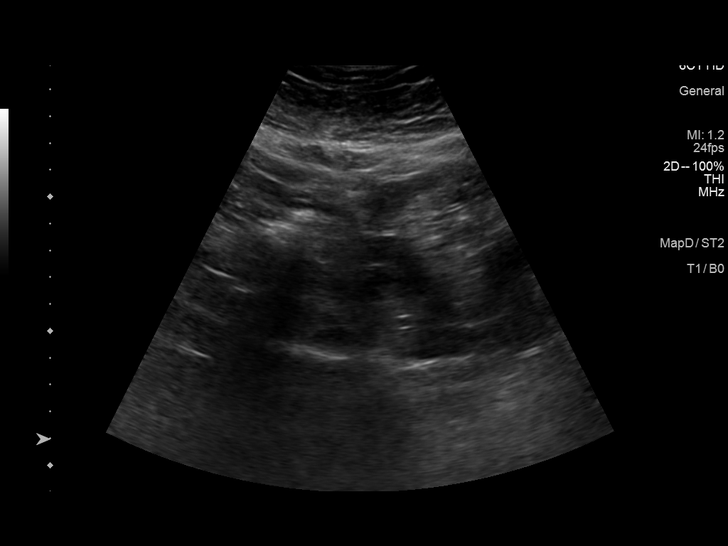
[im 14/18]
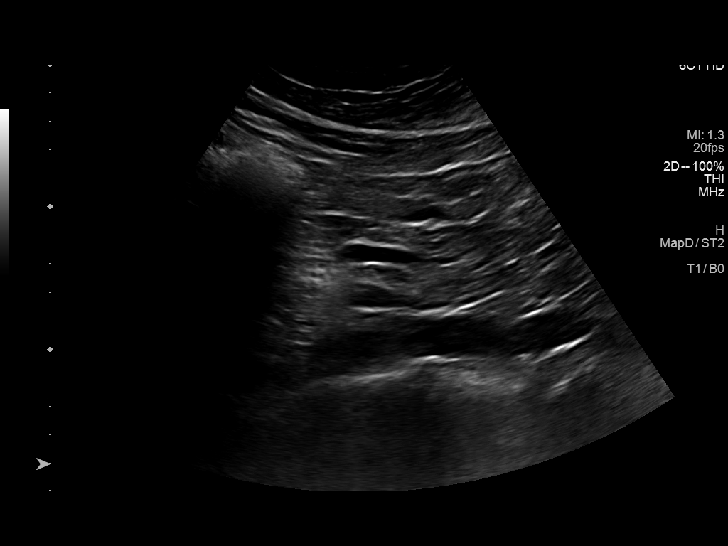
[im 15/18]
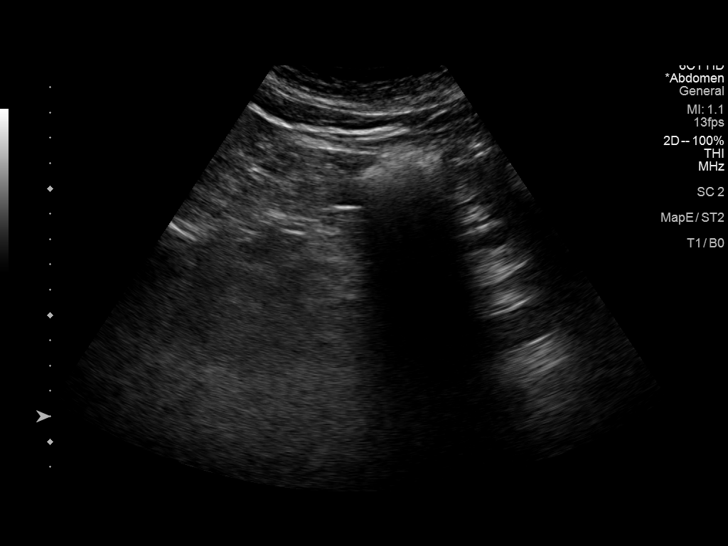
[im 17/18]
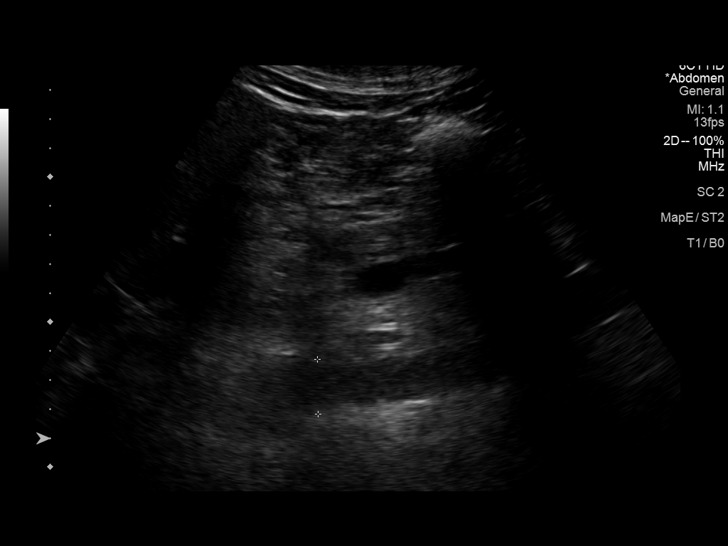
[im 18/18]
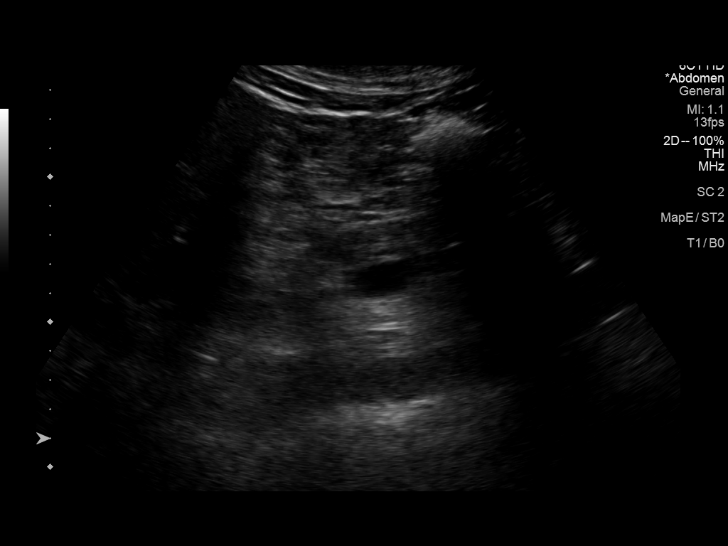

[14 of 18 positions shown; findings below may reference images not displayed]

FINDINGS: Abdominal aortic measurements as follows:

Proximal:  2.0 cm

Mid:  2.4 cm

Distal:  2.2 cm
IMPRESSION: Sonographic survey negative for abdominal aortic aneurysm.

## 2019-11-09 ENCOUNTER — Other Ambulatory Visit: Payer: Self-pay | Admitting: Family Medicine

## 2019-11-09 DIAGNOSIS — R945 Abnormal results of liver function studies: Secondary | ICD-10-CM

## 2019-11-09 DIAGNOSIS — R7989 Other specified abnormal findings of blood chemistry: Secondary | ICD-10-CM

## 2019-11-16 ENCOUNTER — Ambulatory Visit
Admission: RE | Admit: 2019-11-16 | Discharge: 2019-11-16 | Disposition: A | Discharge status: Home / Self Care | Payer: Medicare Other | Source: Ambulatory Visit | Attending: Family Medicine | Admitting: Family Medicine

## 2019-11-16 DIAGNOSIS — R945 Abnormal results of liver function studies: Secondary | ICD-10-CM

## 2019-11-16 DIAGNOSIS — R7989 Other specified abnormal findings of blood chemistry: Secondary | ICD-10-CM

## 2020-05-10 DIAGNOSIS — Z20828 Contact with and (suspected) exposure to other viral communicable diseases: Secondary | ICD-10-CM | POA: Diagnosis not present

## 2020-07-28 DIAGNOSIS — H25043 Posterior subcapsular polar age-related cataract, bilateral: Secondary | ICD-10-CM | POA: Diagnosis not present

## 2020-07-28 DIAGNOSIS — H2513 Age-related nuclear cataract, bilateral: Secondary | ICD-10-CM | POA: Diagnosis not present

## 2020-10-23 ENCOUNTER — Encounter: Payer: Self-pay | Admitting: Cardiology

## 2020-10-23 ENCOUNTER — Other Ambulatory Visit: Payer: Self-pay

## 2020-10-23 ENCOUNTER — Ambulatory Visit: Payer: Medicare PPO | Admitting: Cardiology

## 2020-10-23 VITALS — BP 120/70 | HR 54 | Ht 68.0 in | Wt 200.0 lb

## 2020-10-23 DIAGNOSIS — I2510 Atherosclerotic heart disease of native coronary artery without angina pectoris: Secondary | ICD-10-CM

## 2020-10-23 DIAGNOSIS — I2583 Coronary atherosclerosis due to lipid rich plaque: Secondary | ICD-10-CM

## 2020-10-23 DIAGNOSIS — R7301 Impaired fasting glucose: Secondary | ICD-10-CM | POA: Diagnosis not present

## 2020-10-23 DIAGNOSIS — Z8249 Family history of ischemic heart disease and other diseases of the circulatory system: Secondary | ICD-10-CM

## 2020-10-23 DIAGNOSIS — Z0000 Encounter for general adult medical examination without abnormal findings: Secondary | ICD-10-CM | POA: Diagnosis not present

## 2020-10-23 DIAGNOSIS — E782 Mixed hyperlipidemia: Secondary | ICD-10-CM | POA: Diagnosis not present

## 2020-10-23 DIAGNOSIS — R319 Hematuria, unspecified: Secondary | ICD-10-CM | POA: Diagnosis not present

## 2020-10-23 DIAGNOSIS — Z125 Encounter for screening for malignant neoplasm of prostate: Secondary | ICD-10-CM | POA: Diagnosis not present

## 2020-10-23 DIAGNOSIS — I679 Cerebrovascular disease, unspecified: Secondary | ICD-10-CM | POA: Diagnosis not present

## 2020-10-23 MED ORDER — ATORVASTATIN CALCIUM 40 MG PO TABS: 40.0000 mg | ORAL_TABLET | Freq: Every day | ORAL | 3 refills | Status: DC

## 2020-10-23 NOTE — Progress Notes (Signed)
Cardiology Office Note:    Date:  10/23/2020   ID:  Kevin Beltran, DOB [birth year], MRN [id number]  PCP:  Lupita Raider, MD  [facility] Cardiologist:  Donato Schultz, MD  [facility] Electrophysiologist:  None   Referring MD: Lupita Raider, MD     History of Present Illness:    Kevin Beltran is a 70 y.o. male here for the follow-up of coronary artery disease.  Underwent cardiac catheterization on 12/01/2017: 1. Mild, non-obstructive coronary artery disease. 2. Normal left ventricular contraction. 3. Normal left ventricular filling pressure.  Recommendations: 1. Continue medical therapy and risk factor modification to prevent progression of coronary artery disease.  Diagnostic Dominance: Right      Previously had abnormal exercise treadmill test.His father died at age 77 from MI.  Paternal grandfather died at age 39 from CAD.  Paternal uncle died at age 75 from CAD.  He used to smoke socially in his 17s.   Global amnesia 12 years ago. - few hours symptoms. Confused.  Was told that he had a mini stroke.  He was placed on Plavix monotherapy.    Overall, he enjoys hiking, spending time with his grandchildren.  Past Medical History:  Diagnosis Date  . Chronic cystitis with hematuria   . CVA (cerebral vascular accident) (HCC)   . Elevated uric acid in blood   . High cholesterol   . Transient global amnesia   . White coat syndrome with diagnosis of hypertension     Past Surgical History:  Procedure Laterality Date  . LEFT HEART CATH AND CORONARY ANGIOGRAPHY N/A 12/01/2017   Procedure: LEFT HEART CATH AND CORONARY ANGIOGRAPHY;  Surgeon: Yvonne Kendall, MD;  [facility];  Service: Cardiovascular;  Laterality: N/A;    Current Medications: Current Meds  Medication Sig  . atorvastatin (LIPITOR) 40 MG tablet Take 1 tablet (40 mg total) by mouth daily.  . clopidogrel (PLAVIX) 75 MG tablet Take 75 mg by mouth daily.  . Multiple Vitamin (MULTIVITAMIN)  tablet Take 1 tablet by mouth daily.  . Omega-3 Fatty Acids (FISH OIL) 1200 MG CAPS Take 1 capsule by mouth 2 (two) times daily.  . [DISCONTINUED] atorvastatin (LIPITOR) 20 MG tablet Take 20 mg by mouth daily.     Allergies:   Oruvail [ketoprofen] and Monogen [a-g pro]   Social History   Socioeconomic History  . Marital status: Married    Spouse name: Stark Bray  . Number of children: 2  . Years of education: Not on file  . Highest education level: Not on file  Occupational History  . Occupation: retired Retail buyer  Tobacco Use  . Smoking status: Former Games developer  . Smokeless tobacco: Never Used  Vaping Use  . Vaping Use: Never used  Substance and Sexual Activity  . Alcohol use: Yes    Comment: 12/week  . Drug use: No  . Sexual activity: Not on file  Other Topics Concern  . Not on file  Social History Narrative  . Not on file   Social Determinants of Health   Financial Resource Strain: Not on file  Food Insecurity: Not on file  Transportation Needs: Not on file  Physical Activity: Not on file  Stress: Not on file  Social Connections: Not on file     Family History: The patient's family history includes AAA (abdominal aortic aneurysm) in his mother; COPD in his mother; Cancer in his maternal grandfather; Coronary artery disease in his father, paternal grandfather, and paternal uncle; Diabetes in  his sister; High Cholesterol in his sister; Hypertension in his sister; Kidney cancer in his mother.  ROS:   Please see the history of present illness.     All other systems reviewed and are negative.  EKGs/Labs/Other Studies Reviewed:    The following studies were reviewed today: Cath as above  EKG:  EKG is  ordered today.  The ekg ordered today demonstrates sinus bradycardia 54 no other abnormalities  Recent Labs: No results found for requested labs within last 8760 hours.  Recent Lipid Panel No results found for: CHOL, TRIG, HDL, CHOLHDL, VLDL, LDLCALC,  LDLDIRECT   Risk Assessment/Calculations:      Physical Exam:    VS:  BP 120/70 (BP Location: Left Arm, Patient Position: Sitting, Cuff Size: Normal)   Pulse (!) 54   Ht 5\' 8"  (1.727 m)   Wt 200 lb (90.7 kg)   SpO2 96%   BMI 30.41 kg/m     Wt Readings from Last 3 Encounters:  10/23/20 200 lb (90.7 kg)  12/01/17 192 lb (87.1 kg)  11/28/17 197 lb (89.4 kg)     GEN:  Well nourished, well developed in no acute distress HEENT: Normal NECK: No JVD; No carotid bruits LYMPHATICS: No lymphadenopathy CARDIAC: RRR, no murmurs, rubs, gallops RESPIRATORY:  Clear to auscultation without rales, wheezing or rhonchi  ABDOMEN: Soft, non-tender, non-distended MUSCULOSKELETAL:  No edema; No deformity  SKIN: Warm and dry NEUROLOGIC:  Alert and oriented x 3 PSYCHIATRIC:  Normal affect   ASSESSMENT:    1. Coronary artery disease due to lipid rich plaque   2. Family history of ASCVD   3. Mixed hyperlipidemia    PLAN:    In order of problems listed above:  Coronary artery disease -Mild nonobstructive disease as described by cardiac catheterization above in 2019 after abnormal exercise treadmill test.  He is not having any symptoms. As she has been on Plavix for several years approximately 14 since he was told he had a prior mini stroke or TIA. -I will go ahead and increase his atorvastatin from 20 to 40 mg for high intensity therapy given his underlying CAD.  No myalgias with his medications.  Hyperlipidemia -Increasing atorvastatin from 20-40.  Last LDL was 64 excellent.  I would like for him to be on high intensity dose however.  Hemoglobin 15.9 creatinine 1.04      Medication Adjustments/Labs and Tests Ordered: Current medicines are reviewed at length with the patient today.  Concerns regarding medicines are outlined above.  Orders Placed This Encounter  Procedures  . EKG 12-Lead   Meds ordered this encounter  Medications  . atorvastatin (LIPITOR) 40 MG tablet    Sig: Take  1 tablet (40 mg total) by mouth daily.    Dispense:  90 tablet    Refill:  3    Patient Instructions  Medication Instructions:  Please increase your Atorvastatin to 40 mg a day.  Continue all other medications as listed.  *If you need a refill on your cardiac medications before your next appointment, please call your pharmacy*  Follow-Up: At [facility], you and your health needs are our priority.  As part of our continuing mission to provide you with exceptional heart care, we have created designated Provider Care Teams.  These Care Teams include your primary Cardiologist (physician) and Advanced Practice Providers (APPs -  Physician Assistants and Nurse Practitioners) who all work together to provide you with the care you need, when you need it.  We recommend signing  up for the patient portal called "MyChart".  Sign up information is provided on this After Visit Summary.  MyChart is used to connect with patients for Virtual Visits (Telemedicine).  Patients are able to view lab/test results, encounter notes, upcoming appointments, etc.  Non-urgent messages can be sent to your provider as well.   To learn more about what you can do with MyChart, go to [email or web address].    Your next appointment:   12 month(s)  The format for your next appointment:   In Person  Provider:   Donato Schultz, MD  Thank you for choosing [facility]!!        Signed, Donato Schultz, MD  10/23/2020 12:28 PM    [facility]

## 2020-10-23 NOTE — Patient Instructions (Signed)
Medication Instructions:  Please increase your Atorvastatin to 40 mg a day.  Continue all other medications as listed.  *If you need a refill on your cardiac medications before your next appointment, please call your pharmacy*  Follow-Up: At [facility], you and your health needs are our priority.  As part of our continuing mission to provide you with exceptional heart care, we have created designated Provider Care Teams.  These Care Teams include your primary Cardiologist (physician) and Advanced Practice Providers (APPs -  Physician Assistants and Nurse Practitioners) who all work together to provide you with the care you need, when you need it.  We recommend signing up for the patient portal called "MyChart".  Sign up information is provided on this After Visit Summary.  MyChart is used to connect with patients for Virtual Visits (Telemedicine).  Patients are able to view lab/test results, encounter notes, upcoming appointments, etc.  Non-urgent messages can be sent to your provider as well.   To learn more about what you can do with MyChart, go to [email or web address].    Your next appointment:   12 month(s)  The format for your next appointment:   In Person  Provider:   Donato Schultz, MD  Thank you for choosing [facility]!!

## 2020-11-13 ENCOUNTER — Ambulatory Visit: Payer: Medicare PPO | Admitting: Cardiology

## 2021-06-08 DIAGNOSIS — U071 COVID-19: Secondary | ICD-10-CM | POA: Diagnosis not present

## 2021-06-14 IMAGING — US US ABDOMEN LIMITED
1 series · 14 of 25 positions shown · non-contrast
Comparison: July 05, 2006.

CLINICAL DATA: Abnormal liver function tests.

EXAM:
ULTRASOUND ABDOMEN LIMITED RIGHT UPPER QUADRANT

[Series 1: us abdomen limited · 0.25mm/px · 14 of 43 slices shown]
[im 1/43]
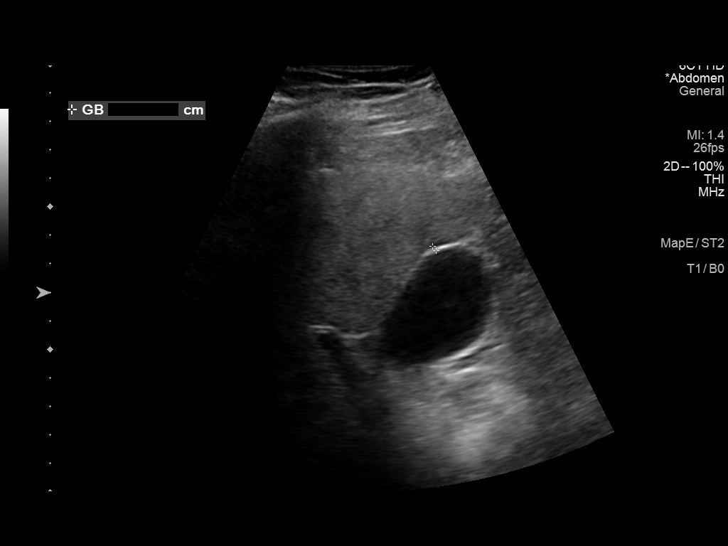
[im 4/43]
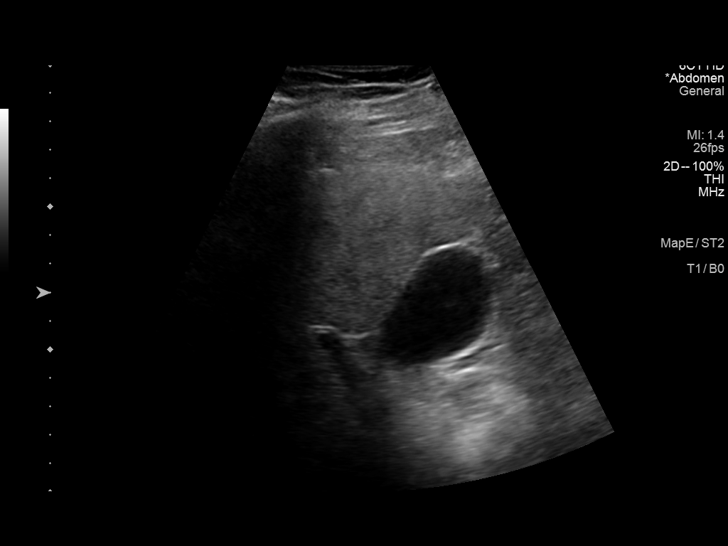
[im 8/43]
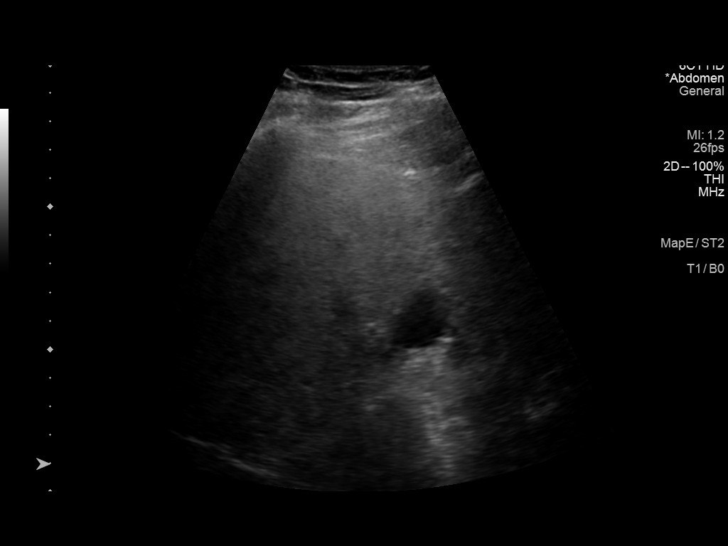
[im 11/43]
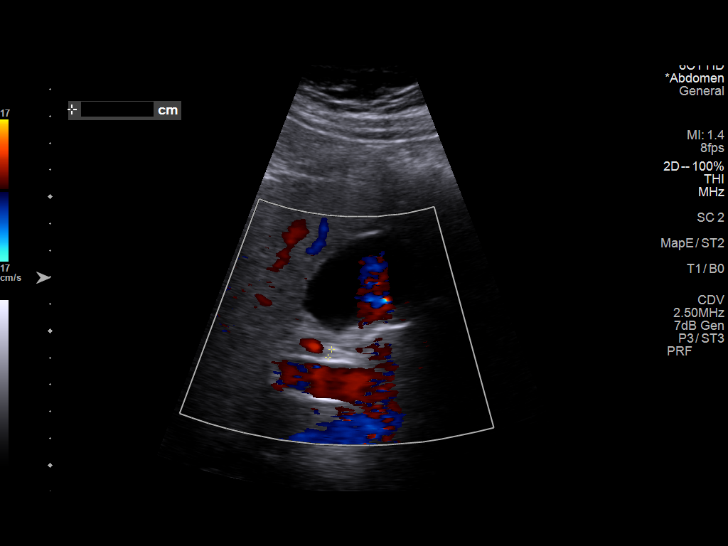
[im 15/43]
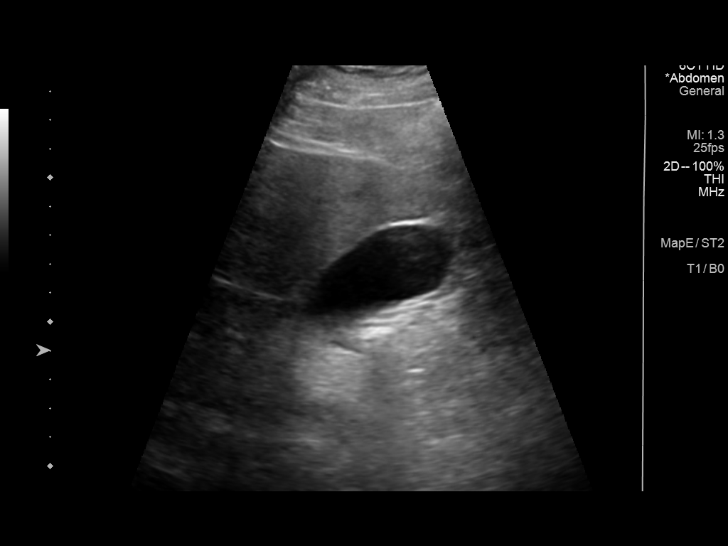
[im 16/43]
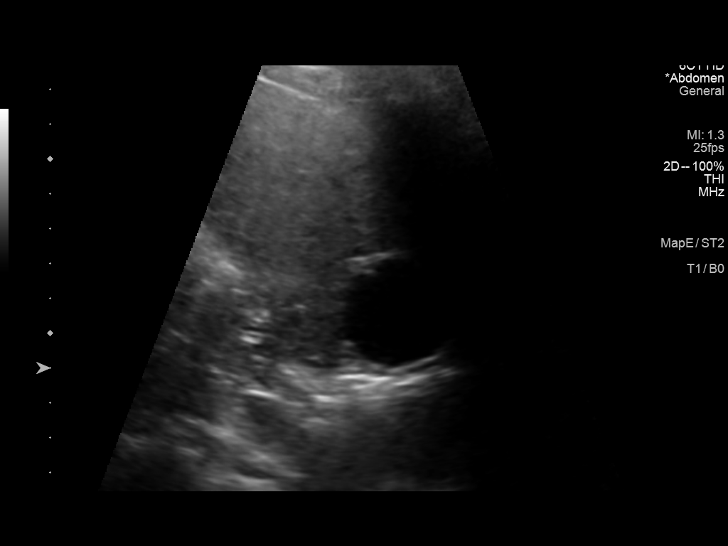
[im 20/43]
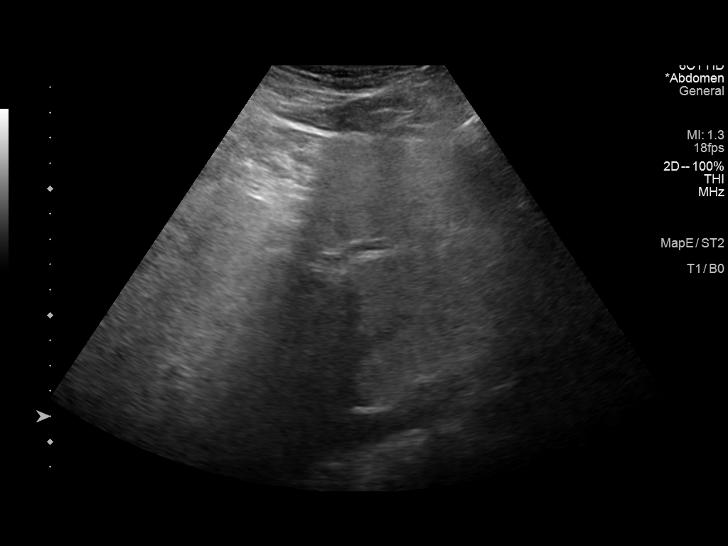
[im 23/43]
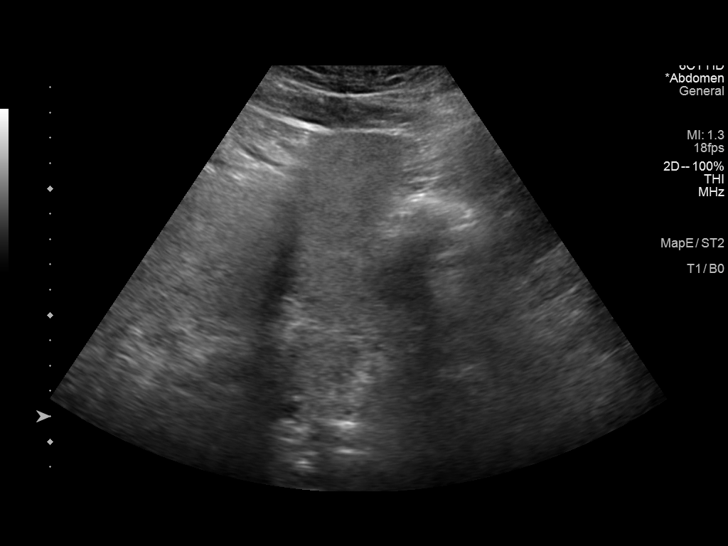
[im 27/43]
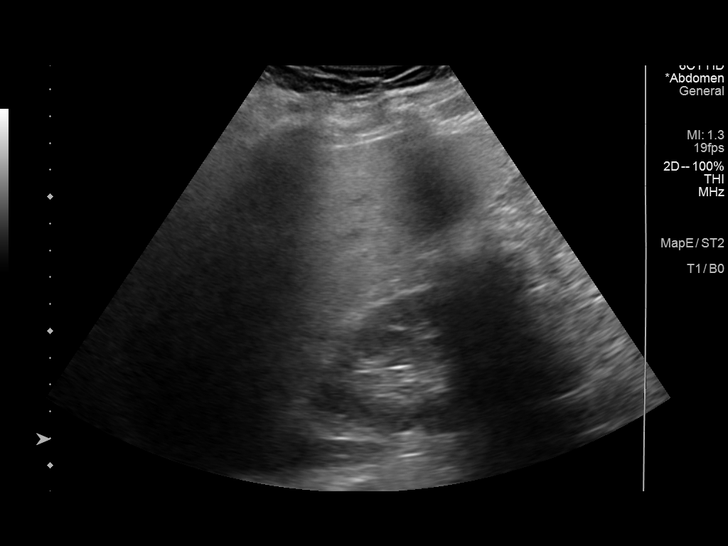
[im 29/43]
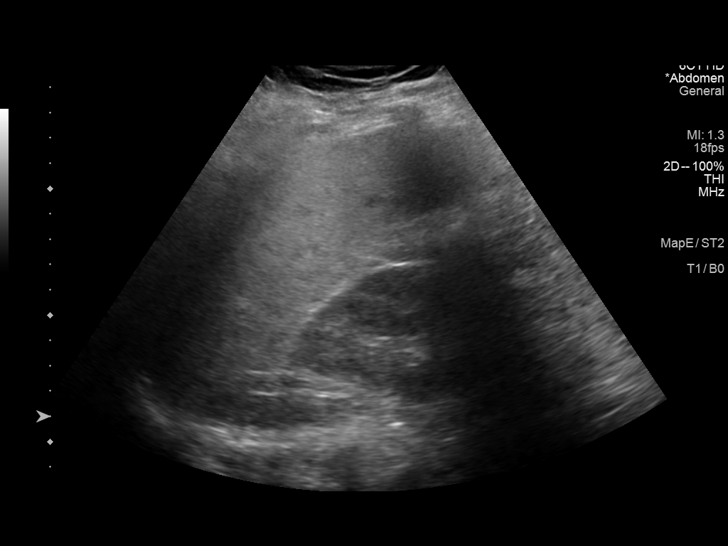
[im 32/43]
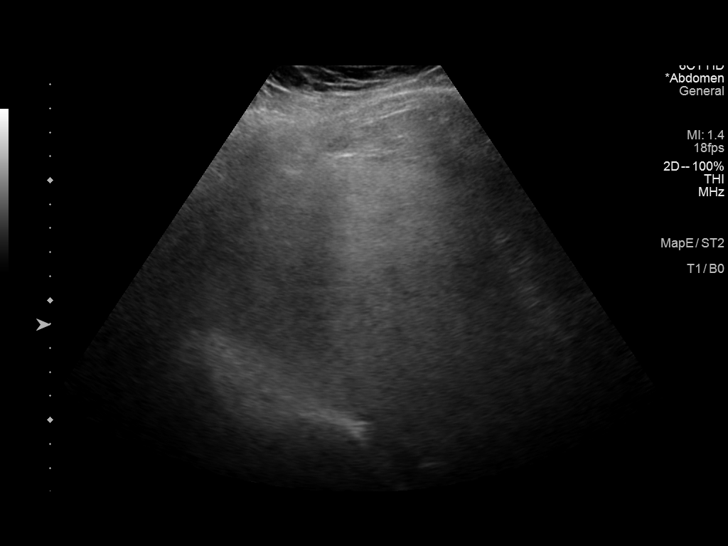
[im 36/43]
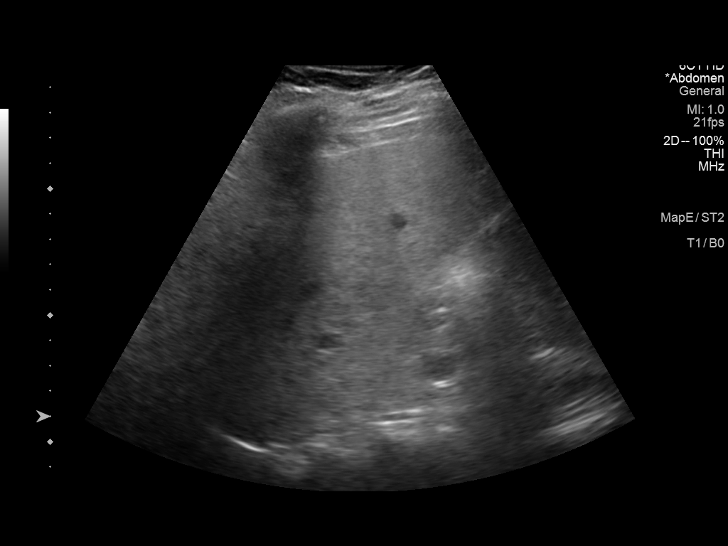
[im 39/43]
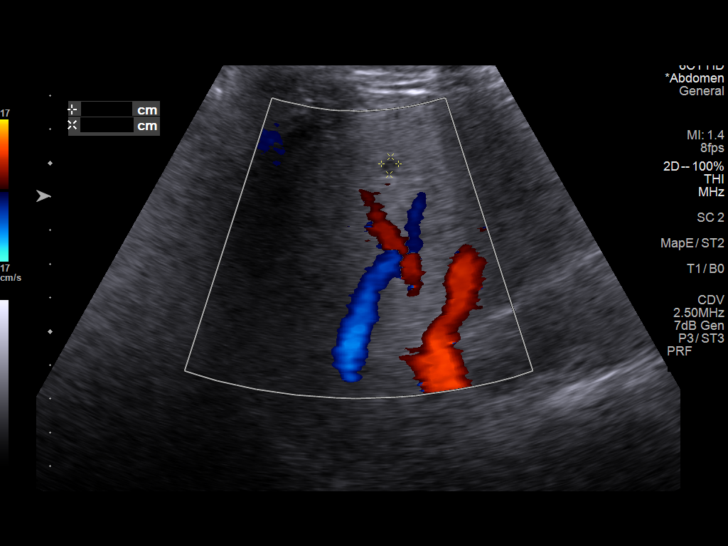
[im 43/43]
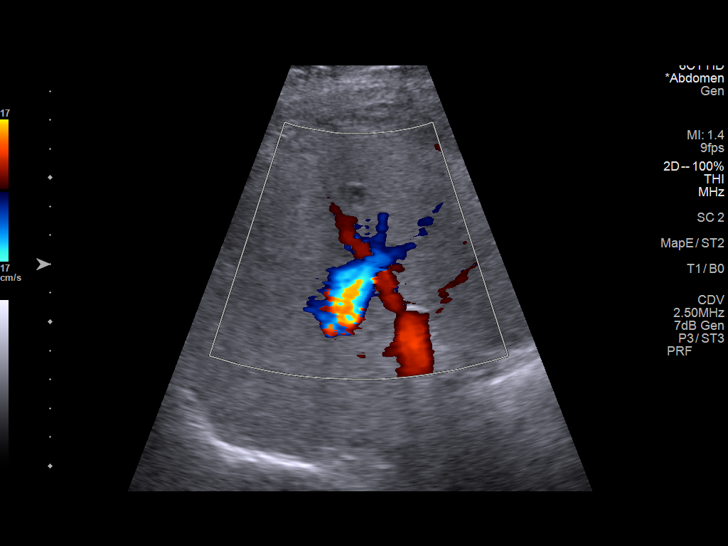

[14 of 25 positions shown; findings below may reference images not displayed]

FINDINGS: Gallbladder:

No gallstones or wall thickening visualized. No sonographic Murphy
sign noted by sonographer. Probable 5 mm gallbladder polyp is noted.

Common bile duct:

Diameter: 3 mm which is within normal limits.

Liver:

Increased echogenicity of hepatic parenchyma is noted suggesting
hepatic steatosis or other diffuse hepatocellular disease. 7 mm cyst
is noted in right hepatic lobe. Portal vein is patent on color
Doppler imaging with normal direction of blood flow towards the
liver.

Other: None.
IMPRESSION: Increased echogenicity of hepatic parenchyma is noted suggesting
hepatic steatosis or other diffuse hepatocellular disease. Small
right hepatic cyst is noted. Small gallbladder polyp is noted.

## 2021-08-06 DIAGNOSIS — H25041 Posterior subcapsular polar age-related cataract, right eye: Secondary | ICD-10-CM | POA: Diagnosis not present

## 2021-08-06 DIAGNOSIS — H5213 Myopia, bilateral: Secondary | ICD-10-CM | POA: Diagnosis not present

## 2021-08-06 DIAGNOSIS — H2513 Age-related nuclear cataract, bilateral: Secondary | ICD-10-CM | POA: Diagnosis not present

## 2021-08-25 DIAGNOSIS — J069 Acute upper respiratory infection, unspecified: Secondary | ICD-10-CM | POA: Diagnosis not present

## 2021-09-28 DIAGNOSIS — R1032 Left lower quadrant pain: Secondary | ICD-10-CM | POA: Diagnosis not present

## 2021-10-19 ENCOUNTER — Other Ambulatory Visit: Payer: Self-pay | Admitting: Cardiology

## 2021-11-02 DIAGNOSIS — Z125 Encounter for screening for malignant neoplasm of prostate: Secondary | ICD-10-CM | POA: Diagnosis not present

## 2021-11-02 DIAGNOSIS — K760 Fatty (change of) liver, not elsewhere classified: Secondary | ICD-10-CM | POA: Diagnosis not present

## 2021-11-02 DIAGNOSIS — E782 Mixed hyperlipidemia: Secondary | ICD-10-CM | POA: Diagnosis not present

## 2021-11-02 DIAGNOSIS — Z0000 Encounter for general adult medical examination without abnormal findings: Secondary | ICD-10-CM | POA: Diagnosis not present

## 2021-11-02 DIAGNOSIS — R319 Hematuria, unspecified: Secondary | ICD-10-CM | POA: Diagnosis not present

## 2021-11-02 DIAGNOSIS — R7301 Impaired fasting glucose: Secondary | ICD-10-CM | POA: Diagnosis not present

## 2021-11-02 DIAGNOSIS — I679 Cerebrovascular disease, unspecified: Secondary | ICD-10-CM | POA: Diagnosis not present

## 2022-01-13 ENCOUNTER — Other Ambulatory Visit: Payer: Self-pay | Admitting: Cardiology

## 2022-01-25 ENCOUNTER — Encounter: Payer: Self-pay | Admitting: Cardiology

## 2022-01-25 ENCOUNTER — Ambulatory Visit: Payer: Medicare PPO | Admitting: Cardiology

## 2022-01-25 DIAGNOSIS — I2510 Atherosclerotic heart disease of native coronary artery without angina pectoris: Secondary | ICD-10-CM | POA: Diagnosis not present

## 2022-01-25 DIAGNOSIS — E7800 Pure hypercholesterolemia, unspecified: Secondary | ICD-10-CM

## 2022-01-25 NOTE — Assessment & Plan Note (Addendum)
LDL now 64.  Atorvastatin 40 mg.  Excellent.  At goal.  No myalgias. ?

## 2022-01-25 NOTE — Progress Notes (Signed)
?Cardiology Office Note:   ? ?Date:  01/25/2022  ? ?ID:  [name], DOB 14-Dec-1950, MRN [id number] ? ?PCP:  Lupita Raider, MD ?  ?[facility] Providers ?Cardiologist:  Donato Schultz, MD    ? ?Referring MD: Lupita Raider, MD  ? ? ?History of Present Illness:   ? ?Kevin Beltran is a 71 y.o. male  follow-up of coronary artery disease.  Underwent cardiac catheterization on 12/01/2017: ?Mild, non-obstructive coronary artery disease. ?Normal left ventricular contraction. ?Normal left ventricular filling pressure. ?  ?Recommendations: ?Continue medical therapy and risk factor modification to prevent progression of coronary artery disease. ?  ?Diagnostic ?Dominance: Right ?  ?  ?  ?  ?Previously had abnormal exercise treadmill test.His father died at age 45 from MI.  Paternal grandfather died at age 6 from CAD.  Paternal uncle died at age 57 from CAD.  He used to smoke socially in his 11s. ?  ?  ?Global amnesia 12 years ago. - few hours symptoms. Confused.  Was told that he had a mini stroke.  He was placed on Plavix monotherapy.   ? ?Overall, he enjoys hiking, spending time with his grandchildren. ? ?Enjoys fishing down in [place].  No chest pain no fevers chills nausea vomiting syncope bleeding.  Tolerating his medications well. ? ?Past Medical History:  ?Diagnosis Date  ? Chronic cystitis with hematuria   ? CVA (cerebral vascular accident) [facility]   ? Elevated uric acid in blood   ? High cholesterol   ? Transient global amnesia   ? White coat syndrome with diagnosis of hypertension   ? ? ?Past Surgical History:  ?Procedure Laterality Date  ? LEFT HEART CATH AND CORONARY ANGIOGRAPHY N/A 12/01/2017  ? Procedure: LEFT HEART CATH AND CORONARY ANGIOGRAPHY;  Surgeon: Yvonne Kendall, MD;  Location: [facility];  Service: Cardiovascular;  Laterality: N/A;  ? ? ?Current Medications: ?Current Meds  ?Medication Sig  ? atorvastatin (LIPITOR) 40 MG tablet TAKE 1 TABLET BY MOUTH EVERY DAY  ? clopidogrel (PLAVIX) 75 MG  tablet Take 75 mg by mouth daily.  ? Multiple Vitamin (MULTIVITAMIN) tablet Take 1 tablet by mouth daily.  ? Omega-3 Fatty Acids (FISH OIL) 1200 MG CAPS Take 1 capsule by mouth 2 (two) times daily.  ?  ? ?Allergies:   Oruvail [ketoprofen] and Monogen [a-g pro]  ? ?Social History  ? ?Socioeconomic History  ? Marital status: Married  ?  Spouse name: Stark Bray  ? Number of children: 2  ? Years of education: Not on file  ? Highest education level: Not on file  ?Occupational History  ? Occupation: retired Retail buyer  ?Tobacco Use  ? Smoking status: Former  ? Smokeless tobacco: Never  ?Vaping Use  ? Vaping Use: Never used  ?Substance and Sexual Activity  ? Alcohol use: Yes  ?  Comment: 12/week  ? Drug use: No  ? Sexual activity: Not on file  ?Other Topics Concern  ? Not on file  ?Social History Narrative  ? Not on file  ? ?Social Determinants of Health  ? ?Financial Resource Strain: Not on file  ?Food Insecurity: Not on file  ?Transportation Needs: Not on file  ?Physical Activity: Not on file  ?Stress: Not on file  ?Social Connections: Not on file  ?  ? ?Family History: ?The patient's family history includes AAA (abdominal aortic aneurysm) in his mother; COPD in his mother; Cancer in his maternal grandfather; Coronary artery disease in his father, paternal grandfather, and paternal uncle;  Diabetes in his sister; High Cholesterol in his sister; Hypertension in his sister; Kidney cancer in his mother. ? ?ROS:   ?Please see the history of present illness.    ? All other systems reviewed and are negative. ? ?EKGs/Labs/Other Studies Reviewed:   ? ? ?EKG:  EKG is  ordered today.  The ekg ordered today demonstrates sinus bradycardia 52 ? ?Recent Labs: ?No results found for requested labs within last 8760 hours.  ?Recent Lipid Panel ?No results found for: CHOL, TRIG, HDL, CHOLHDL, VLDL, LDLCALC, LDLDIRECT ? ? ?Risk Assessment/Calculations:   ? ? ?    ? ?   ? ?Physical Exam:   ? ?VS:  BP 112/70 (BP Location: Left Arm, Patient  Position: Sitting, Cuff Size: Normal)   Pulse (!) 52   Ht 5\' 8"  (1.727 m)   Wt 197 lb (89.4 kg)   SpO2 95%   BMI 29.95 kg/m?    ? ?Wt Readings from Last 3 Encounters:  ?01/25/22 197 lb (89.4 kg)  ?10/23/20 200 lb (90.7 kg)  ?12/01/17 192 lb (87.1 kg)  ?  ? ?GEN:  Well nourished, well developed in no acute distress ?HEENT: Normal ?NECK: No JVD; No carotid bruits ?LYMPHATICS: No lymphadenopathy ?CARDIAC: RRR, no murmurs, no rubs, gallops ?RESPIRATORY:  Clear to auscultation without rales, wheezing or rhonchi  ?ABDOMEN: Soft, non-tender, non-distended ?MUSCULOSKELETAL:  No edema; No deformity  ?SKIN: Warm and dry ?NEUROLOGIC:  Alert and oriented x 3 ?PSYCHIATRIC:  Normal affect  ? ?ASSESSMENT:   ? ?1. Coronary artery disease involving native coronary artery of native heart without angina pectoris   ?2. Pure hypercholesterolemia   ? ?PLAN:   ? ?In order of problems listed above: ? ?Coronary artery disease involving native coronary artery of native heart without angina pectoris ?Cardiac catheterization 2019 mild obstructive disease.  Continuing with Plavix which she was started on several years ago after a prior TIA/mini stroke.  Atorvastatin was increased to high intensity dose 40 mg given his underlying nonobstructive CAD. ? ?Pure hypercholesterolemia ?LDL now 64.  Atorvastatin 40 mg.  Excellent.  At goal.  No myalgias. ?  ? ? ?  ? ? ?Medication Adjustments/Labs and Tests Ordered: ?Current medicines are reviewed at length with the patient today.  Concerns regarding medicines are outlined above.  ?Orders Placed This Encounter  ?Procedures  ? EKG 12-Lead  ? ?No orders of the defined types were placed in this encounter. ? ? ?Patient Instructions  ?Medication Instructions:  ?The current medical regimen is effective;  continue present plan and medications. ? ?*If you need a refill on your cardiac medications before your next appointment, please call your pharmacy* ? ?Follow-Up: ?At [facility], you and your health  needs are our priority.  As part of our continuing mission to provide you with exceptional heart care, we have created designated Provider Care Teams.  These Care Teams include your primary Cardiologist (physician) and Advanced Practice Providers (APPs -  Physician Assistants and Nurse Practitioners) who all work together to provide you with the care you need, when you need it. ? ?We recommend signing up for the patient portal called "MyChart".  Sign up information is provided on this After Visit Summary.  MyChart is used to connect with patients for Virtual Visits (Telemedicine).  Patients are able to view lab/test results, encounter notes, upcoming appointments, etc.  Non-urgent messages can be sent to your provider as well.   ?To learn more about what you can do with MyChart, go to [email or web address].   ? ?  Your next appointment:   ?1 year(s) ? ?The format for your next appointment:   ?In Person ? ?Provider:   ?Donato Schultz, MD { ? ? ?Important Information About Sugar ? ? ? ? ?   ? ?Signed, ?Donato Schultz, MD  ?01/25/2022 9:54 AM    ?[facility] ?

## 2022-01-25 NOTE — Patient Instructions (Signed)
Medication Instructions:  The current medical regimen is effective;  continue present plan and medications.  *If you need a refill on your cardiac medications before your next appointment, please call your pharmacy*  Follow-Up: At [facility], you and your health needs are our priority.  As part of our continuing mission to provide you with exceptional heart care, we have created designated Provider Care Teams.  These Care Teams include your primary Cardiologist (physician) and Advanced Practice Providers (APPs -  Physician Assistants and Nurse Practitioners) who all work together to provide you with the care you need, when you need it.  We recommend signing up for the patient portal called "MyChart".  Sign up information is provided on this After Visit Summary.  MyChart is used to connect with patients for Virtual Visits (Telemedicine).  Patients are able to view lab/test results, encounter notes, upcoming appointments, etc.  Non-urgent messages can be sent to your provider as well.   To learn more about what you can do with MyChart, go to [email or web address].    Your next appointment:   1 year(s)  The format for your next appointment:   In Person  Provider:   Daryle Skains, MD {   Important Information About Sugar       

## 2022-01-25 NOTE — Assessment & Plan Note (Signed)
Cardiac catheterization 2019 mild obstructive disease.  Continuing with Plavix which she was started on several years ago after a prior TIA/mini stroke.  Atorvastatin was increased to high intensity dose 40 mg given his underlying nonobstructive CAD. ?

## 2022-04-11 ENCOUNTER — Other Ambulatory Visit: Payer: Self-pay | Admitting: Cardiology

## 2022-04-26 DIAGNOSIS — R7303 Prediabetes: Secondary | ICD-10-CM | POA: Diagnosis not present

## 2022-08-16 DIAGNOSIS — H5213 Myopia, bilateral: Secondary | ICD-10-CM | POA: Diagnosis not present

## 2022-08-16 DIAGNOSIS — H2513 Age-related nuclear cataract, bilateral: Secondary | ICD-10-CM | POA: Diagnosis not present

## 2022-11-03 DIAGNOSIS — E782 Mixed hyperlipidemia: Secondary | ICD-10-CM | POA: Diagnosis not present

## 2022-11-03 DIAGNOSIS — Z0000 Encounter for general adult medical examination without abnormal findings: Secondary | ICD-10-CM | POA: Diagnosis not present

## 2022-11-03 DIAGNOSIS — R7301 Impaired fasting glucose: Secondary | ICD-10-CM | POA: Diagnosis not present

## 2022-11-03 DIAGNOSIS — Z23 Encounter for immunization: Secondary | ICD-10-CM | POA: Diagnosis not present

## 2022-11-03 DIAGNOSIS — I679 Cerebrovascular disease, unspecified: Secondary | ICD-10-CM | POA: Diagnosis not present

## 2022-11-03 DIAGNOSIS — R319 Hematuria, unspecified: Secondary | ICD-10-CM | POA: Diagnosis not present

## 2022-11-03 DIAGNOSIS — Z125 Encounter for screening for malignant neoplasm of prostate: Secondary | ICD-10-CM | POA: Diagnosis not present

## 2022-11-03 DIAGNOSIS — K760 Fatty (change of) liver, not elsewhere classified: Secondary | ICD-10-CM | POA: Diagnosis not present

## 2022-11-03 DIAGNOSIS — I2510 Atherosclerotic heart disease of native coronary artery without angina pectoris: Secondary | ICD-10-CM | POA: Diagnosis not present

## 2023-04-09 ENCOUNTER — Other Ambulatory Visit: Payer: Self-pay | Admitting: Cardiology

## 2023-06-22 ENCOUNTER — Encounter: Payer: Self-pay | Admitting: Cardiology

## 2023-06-22 ENCOUNTER — Ambulatory Visit: Payer: Medicare PPO | Attending: Cardiology | Admitting: Cardiology

## 2023-06-22 VITALS — BP 130/86 | HR 55 | Ht 68.5 in | Wt 194.0 lb

## 2023-06-22 DIAGNOSIS — E7800 Pure hypercholesterolemia, unspecified: Secondary | ICD-10-CM

## 2023-06-22 DIAGNOSIS — I2510 Atherosclerotic heart disease of native coronary artery without angina pectoris: Secondary | ICD-10-CM | POA: Diagnosis not present

## 2023-06-22 NOTE — Patient Instructions (Signed)
Medication Instructions:  Your physician recommends that you continue on your current medications as directed. Please refer to the Current Medication list given to you today.  *If you need a refill on your cardiac medications before your next appointment, please call your pharmacy*   Lab Work: none If you have labs (blood work) drawn today and your tests are completely normal, you will receive your results only by: MyChart Message (if you have MyChart) OR A paper copy in the mail If you have any lab test that is abnormal or we need to change your treatment, we will call you to review the results.   Testing/Procedures: none   Follow-Up: At [facility], you and your health needs are our priority.  As part of our continuing mission to provide you with exceptional heart care, we have created designated Provider Care Teams.  These Care Teams include your primary Cardiologist (physician) and Advanced Practice Providers (APPs -  Physician Assistants and Nurse Practitioners) who all work together to provide you with the care you need, when you need it.  We recommend signing up for the patient portal called "MyChart".  Sign up information is provided on this After Visit Summary.  MyChart is used to connect with patients for Virtual Visits (Telemedicine).  Patients are able to view lab/test results, encounter notes, upcoming appointments, etc.  Non-urgent messages can be sent to your provider as well.   To learn more about what you can do with MyChart, go to [email or web address].    Your next appointment:   2 year(s)  Provider:   Donato Schultz, MD     Other Instructions

## 2023-06-22 NOTE — Progress Notes (Signed)
Cardiology Office Note:  .   Date:  06/22/2023  ID:  Kevin Beltran, DOB 05/19/1951, MRN [id number] PCP: Lupita Raider, MD  [facility] Providers Cardiologist:  Donato Schultz, MD    History of Present Illness: Marland Kitchen   Kevin Beltran is a 72 y.o. male Discussed with the use of AI scribe software   History of Present Illness   The patient is a 72 year old male with a history of mild nonobstructive coronary artery disease (CAD), as evidenced by a heart catheterization performed in 2019. The patient has been on atorvastatin 40mg  daily for hyperlipidemia and clopidogrel 75mg  daily. The patient also takes fish oil supplements. The patient had an abnormal exercise treadmill test in the past. There is a significant family history of CAD, with the patient's father, paternal grandfather, and an uncle all dying from CAD at relatively young ages. The patient used to smoke socially into his twenties.  Approximately 13-14 years ago, the patient experienced an episode of global amnesia, characterized by a few hours of confusion. This was suspected to be a transient ischemic attack (TIA), and the patient was started on Plavix at that time.  The patient leads an active lifestyle, enjoying hiking, fishing, and walking. The patient is a retired Retail buyer. The patient's most recent labs showed a Hemoglobin A1c of 5.9, HDL of 49, LDL of 53, and a creatinine of 1.0. The patient's LDL is at goal.  The patient reports no new or different symptoms, describing his health as "remarkably, boringly the same." However, the patient did recently strain his back while doing yard work. The patient also enjoys dancing weekly, a hobby he has maintained since retirement in 2010.  The patient's EKG showed a new right bundle branch block, but this was not associated with any symptoms or concerns.      Social History - Retired Retail buyer - Enjoys hiking, fishing, walking, and dancing - Used to smoke socially into his  twenties  Family History - Father died at age 85 from myocardial infarction - Paternal grandfather died at age 68 from CAD - Another uncle died at age 50 from CAD      ROS: No CP, no SOB  Studies Reviewed: Marland Kitchen   EKG Interpretation Date/Time:  Wednesday June 22 2023 09:08:09 EDT Ventricular Rate:  49 PR Interval:  144 QRS Duration:  134 QT Interval:  452 QTC Calculation: 408 R Axis:   -7  Text Interpretation: Sinus bradycardia Possible Left atrial enlargement Right bundle branch block Minimal voltage criteria for LVH, may be normal variant ( R in aVL ) When compared with ECG of 01-Dec-2017 05:54, Right bundle branch block is now Present Confirmed by Donato Schultz ([id number]) on 06/22/2023 9:14:10 AM    Results LABS Hemoglobin A1c: 5.9 HDL: 49 LDL: 53 Potassium: 4.4 Creatinine: 1.0  DIAGNOSTIC Cardiac catheterization: Mild nonobstructive CAD with normal LV function and normal left ventricular filling pressures (12/01/2017) EKG: Right bundle branch block  Risk Assessment/Calculations:            Physical Exam:   VS:  BP 130/86   Pulse (!) 55   Ht 5' 8.5" (1.74 m)   Wt 194 lb (88 kg)   SpO2 96%   BMI 29.07 kg/m    Wt Readings from Last 3 Encounters:  06/22/23 194 lb (88 kg)  01/25/22 197 lb (89.4 kg)  10/23/20 200 lb (90.7 kg)    GEN: Well nourished, well developed in no acute distress NECK: No JVD; No  carotid bruits CARDIAC: RRR, no murmurs, no rubs, no gallops RESPIRATORY:  Clear to auscultation without rales, wheezing or rhonchi  ABDOMEN: Soft, non-tender, non-distended EXTREMITIES:  No edema; No deformity   ASSESSMENT AND PLAN: .    Assessment and Plan    Mild Nonobstructive CAD Stable with no new symptoms. LDL at goal on Atorvastatin 40mg  daily. -Continue Atorvastatin 40mg  daily.  Hyperlipidemia Well controlled with LDL at goal on Atorvastatin 40mg  daily. -Continue Atorvastatin 40mg  daily.  History of TIA No new neurological symptoms. On  Clopidogrel 75mg  daily for secondary prevention. -Continue Clopidogrel 75mg  daily.  Right Bundle Branch Block New finding on EKG. Asymptomatic and not indicative of ischemia. -No specific intervention needed at this time.  General Health Maintenance / Followup Plans -Next follow-up in 2 years, or sooner if new symptoms develop.               Signed, Donato Schultz, MD

## 2023-07-07 ENCOUNTER — Other Ambulatory Visit: Payer: Self-pay | Admitting: Internal Medicine

## 2023-08-22 DIAGNOSIS — H5213 Myopia, bilateral: Secondary | ICD-10-CM | POA: Diagnosis not present

## 2023-08-22 DIAGNOSIS — H2513 Age-related nuclear cataract, bilateral: Secondary | ICD-10-CM | POA: Diagnosis not present

## 2023-08-22 DIAGNOSIS — H25041 Posterior subcapsular polar age-related cataract, right eye: Secondary | ICD-10-CM | POA: Diagnosis not present

## 2023-11-09 DIAGNOSIS — Z0000 Encounter for general adult medical examination without abnormal findings: Secondary | ICD-10-CM | POA: Diagnosis not present

## 2023-11-09 DIAGNOSIS — R7301 Impaired fasting glucose: Secondary | ICD-10-CM | POA: Diagnosis not present

## 2023-11-09 DIAGNOSIS — Z23 Encounter for immunization: Secondary | ICD-10-CM | POA: Diagnosis not present

## 2023-11-09 DIAGNOSIS — I2510 Atherosclerotic heart disease of native coronary artery without angina pectoris: Secondary | ICD-10-CM | POA: Diagnosis not present

## 2023-11-09 DIAGNOSIS — R319 Hematuria, unspecified: Secondary | ICD-10-CM | POA: Diagnosis not present

## 2023-11-09 DIAGNOSIS — Z125 Encounter for screening for malignant neoplasm of prostate: Secondary | ICD-10-CM | POA: Diagnosis not present

## 2023-11-09 DIAGNOSIS — E782 Mixed hyperlipidemia: Secondary | ICD-10-CM | POA: Diagnosis not present

## 2023-11-09 DIAGNOSIS — I679 Cerebrovascular disease, unspecified: Secondary | ICD-10-CM | POA: Diagnosis not present

## 2023-11-09 DIAGNOSIS — K760 Fatty (change of) liver, not elsewhere classified: Secondary | ICD-10-CM | POA: Diagnosis not present

## 2023-11-09 LAB — LAB REPORT - SCANNED: EGFR: 79

## 2023-11-11 ENCOUNTER — Encounter: Payer: Self-pay | Admitting: Cardiology

## 2024-01-13 DIAGNOSIS — J069 Acute upper respiratory infection, unspecified: Secondary | ICD-10-CM | POA: Diagnosis not present

## 2024-04-18 DIAGNOSIS — M25511 Pain in right shoulder: Secondary | ICD-10-CM | POA: Diagnosis not present

## 2024-05-14 DIAGNOSIS — R059 Cough, unspecified: Secondary | ICD-10-CM | POA: Diagnosis not present

## 2024-05-14 DIAGNOSIS — H109 Unspecified conjunctivitis: Secondary | ICD-10-CM | POA: Diagnosis not present

## 2024-05-24 DIAGNOSIS — H16203 Unspecified keratoconjunctivitis, bilateral: Secondary | ICD-10-CM | POA: Diagnosis not present

## 2024-05-31 DIAGNOSIS — H16203 Unspecified keratoconjunctivitis, bilateral: Secondary | ICD-10-CM | POA: Diagnosis not present

## 2024-06-13 DIAGNOSIS — H16203 Unspecified keratoconjunctivitis, bilateral: Secondary | ICD-10-CM | POA: Diagnosis not present

## 2024-06-13 DIAGNOSIS — H15102 Unspecified episcleritis, left eye: Secondary | ICD-10-CM | POA: Diagnosis not present

## 2024-06-27 DIAGNOSIS — H15102 Unspecified episcleritis, left eye: Secondary | ICD-10-CM | POA: Diagnosis not present

## 2024-06-30 ENCOUNTER — Other Ambulatory Visit: Payer: Self-pay | Admitting: Cardiology

## 2024-08-23 DIAGNOSIS — H04123 Dry eye syndrome of bilateral lacrimal glands: Secondary | ICD-10-CM | POA: Diagnosis not present
